# Patient Record
Sex: Male | Born: 1965
Health system: Southern US, Community
[De-identification: ages and names within clinical notes are randomized; demographics above are authoritative.]

## PROBLEM LIST (undated history)

## (undated) DIAGNOSIS — K219 Gastro-esophageal reflux disease without esophagitis: Secondary | ICD-10-CM

## (undated) DIAGNOSIS — H409 Unspecified glaucoma: Secondary | ICD-10-CM

## (undated) DIAGNOSIS — E669 Obesity, unspecified: Secondary | ICD-10-CM

## (undated) DIAGNOSIS — R51 Headache: Secondary | ICD-10-CM

## (undated) DIAGNOSIS — R519 Headache, unspecified: Secondary | ICD-10-CM

## (undated) DIAGNOSIS — E785 Hyperlipidemia, unspecified: Secondary | ICD-10-CM

## (undated) DIAGNOSIS — E119 Type 2 diabetes mellitus without complications: Secondary | ICD-10-CM

## (undated) DIAGNOSIS — Z720 Tobacco use: Secondary | ICD-10-CM

## (undated) HISTORY — PX: COLONOSCOPY: SHX174

## (undated) HISTORY — PX: WISDOM TOOTH EXTRACTION: SHX21

## (undated) HISTORY — PX: POLYPECTOMY: SHX149

## (undated) HISTORY — DX: Headache: R51

## (undated) HISTORY — DX: Obesity, unspecified: E66.9

## (undated) HISTORY — DX: Headache, unspecified: R51.9

## (undated) HISTORY — DX: Hyperlipidemia, unspecified: E78.5

## (undated) HISTORY — DX: Tobacco use: Z72.0

## (undated) HISTORY — DX: Gastro-esophageal reflux disease without esophagitis: K21.9

## (undated) HISTORY — DX: Unspecified glaucoma: H40.9

## (undated) HISTORY — DX: Type 2 diabetes mellitus without complications: E11.9

---

## 2001-05-22 ENCOUNTER — Emergency Department (HOSPITAL_COMMUNITY): Admission: EM | Admit: 2001-05-22 | Discharge: 2001-05-22 | Payer: Self-pay

## 2007-03-23 ENCOUNTER — Inpatient Hospital Stay (HOSPITAL_COMMUNITY): Admission: RE | Admit: 2007-03-23 | Discharge: 2007-03-24 | Payer: Self-pay | Admitting: Neurosurgery

## 2007-06-28 ENCOUNTER — Encounter: Admission: RE | Admit: 2007-06-28 | Discharge: 2007-06-28 | Payer: Self-pay | Admitting: Neurosurgery

## 2008-02-21 HISTORY — PX: BACK SURGERY: SHX140

## 2010-10-05 NOTE — Op Note (Signed)
[name], [name]               ACCOUNT NO.:  [id number]   MEDICAL RECORD NO.:  [id number]          PATIENT TYPE:  INP   LOCATION:  3172                         FACILITY:  [facility]   PHYSICIAN:  Kathaleen Maser. Pool, M.D.    DATE OF BIRTH:  February 19, 1966   DATE OF PROCEDURE:  03/23/2007  DATE OF DISCHARGE:                               OPERATIVE REPORT   ATTENDING PHYSICIAN:  Sherilyn Cooter A. Pool, M.D.   SERVICE:  Neurosurgery.   PREOPERATIVE DIAGNOSIS:  L5-S1 grade I lytic spondylolisthesis.   POSTOPERATIVE DIAGNOSIS:  L5-S1 grade I lytic spondylolisthesis.   PROCEDURE:  L5/S1 Gill procedure with bilateral L5 and S1 decompressive  foraminotomies.  L5-S1 posterior lumbar fusion utilizing Tangent  interbody allograft wedge, Telamon interbody PEEK cage and local  autografting.  Posterolateral arthrodesis utilizing nonsegmental pedicle  fixation and local autografting.   SURGEON:  Kathaleen Maser. Pool, M.D.   [name].   ANESTHESIA:  General oroendotracheal.   INDICATIONS FOR PROCEDURE:  Mr. Schauer is a 45 year old male with  history of back and lower extremity pain, right greater than left,  failing all conservative measure.  Workup demonstrates evidence of grade  I lytic L5-S1 spondylolisthesis and marked foraminal stenosis.  The  patient has been counseled as to the options.  He has decided to proceed  with L5-S1 decompression and fusion in the hopes of improving his  symptoms.   PROCEDURE IN DETAIL:  The patient was taken to the operating room and  placed in a supine position.  After adequate anesthesia was achieved the  patient was turned prone onto the Wilson frame appropriately padding the  patient's lumbar region, prepped and draped sterilely.  A 10 blade was  used to make a linear skin incision overlying the L5-S1 interspace.  This was carried down sharply to midline.  Subperiosteal dissection was  performed exposing the lamina and facet joints at L4, L5 and S1 as well  as the  transverse processes of L5 and the sacral ala bilaterally.  Deep  self-retaining retractor was placed.  Intraoperative fluoroscopy was  used and levels were confirmed.  A Gill procedure was then performed  using Leksell rongeurs, Kerrison rongeurs and high speed drill to  completely remove the lamina of L5 as well as the inferior facets of L5  bilaterally.  All bone was cleaned and used in later autografting.  Rudimentary inferior facets of L5 were also resected from the inferior  aspect of the pedicles of L5.  Superior facetectomies of S1 were  performed.  Wide decompressive foraminotomies were then performed along  the course of exiting L5 and S1 nerve roots bilaterally.  At this point,  a Gill procedure had been completed and wide foraminotomies had been  performed along the L5 and S1 nerve roots.  The epidural ring was placed  with clips, coagulated and cut.  Thecal sac and nerve roots were  mobilized and tracked towards the right.  Disk space was incised with 15  blade in a rectangular fashion.  Widened disk space was then cleaned  and then achieved using pituitary rongeurs, operating room  pituitary  rongeurs and Epstein curettes.  The procedure was then repeated on the  contralateral side.  The disk space was then sequentially distracted up  to 10 mm with a 10 mm distractor left in the patient's right side.  Thecal sac and nerve roots once again protected on the patient's left  side.  Disk space was then reamed and cut with a 10 mm Tangent  instrument.  Soft tissues were removed from the interspace.  A 10 x 22  mm Telamon cage packed with morselized autograft and Progenix putty was  then impacted into place and recessed approximately 2 mm posterior  cortical margin of L5.  Distractor was removed.  The patient's right  side disk space was then reamed and then cut with Tangent instruments on  the right side.  Soft tissues removed from the interspace.  The disk  space was then packed  with morselized autograft.  A 10 x 26 mm Tangent  wedge was then impacted into place and recessed approximately 1 mm from  the posterior cortical margin of L5.  Pedicles of L5 and S1 were then  identified using surface landmarks and intraoperative fluoroscopy.  The  superficial bone overlying the pedicle was then removed using a high  speed drill.  Each pedicle was then probed using pedicle awl.  Each  pedicle awl track was then probed and found to be solidly in bone.  Each  pedicle awl track was then tapped with a 5.25  mini screwdriver.  Each  screw tap hole was probed and found to be solid to bone.  Then 6.75 x 45  mm radius screws were placed bilaterally at L5; 6.75 x 40 mm screws were  placed bilaterally at S1.  Transverse processes and sacral ala were then  decorticated using high speed drill.  Morcellized autograft was then  packed posterolaterally for later fusion.  Short segment titanium rod  was then placed over the screw heads.  Locking caps were then placed  over the screw heads.  The locking caps then engaged and construct under  compression.  Wound was then irrigated with antibiotic solution.  Gelfoam was placed topically for hemostasis and found to be good.  Medium Hemovac drain was left after inspection.  The wound was then  closed in layers using Vicryl suture.  Steri-Strips and sterile dressing  were applied.  There were no complications.  The patient tolerated this  well and he returns to the recovery room postoperatively.           ______________________________  Kathaleen Maser Pool, M.D.     HAP/MEDQ  D:  03/23/2007  T:  03/24/2007  Job:  [id number]

## 2011-03-02 LAB — TYPE AND SCREEN
ABO/RH(D): O POS
Antibody Screen: NEGATIVE

## 2011-03-02 LAB — CBC
Hemoglobin: 17.5 — ABNORMAL HIGH
MCHC: 34.8
RBC: 5.73
WBC: 8.3

## 2011-03-02 LAB — ABO/RH: ABO/RH(D): O POS

## 2014-01-01 ENCOUNTER — Encounter: Payer: Self-pay | Admitting: Medical

## 2014-01-01 ENCOUNTER — Ambulatory Visit (INDEPENDENT_AMBULATORY_CARE_PROVIDER_SITE_OTHER): Payer: 59 | Admitting: Medical

## 2014-01-01 VITALS — BP 122/80 | HR 76 | Temp 98.1°F | Resp 16 | Ht 71.0 in | Wt 284.0 lb

## 2014-01-01 DIAGNOSIS — R5383 Other fatigue: Secondary | ICD-10-CM

## 2014-01-01 DIAGNOSIS — R400 Somnolence: Secondary | ICD-10-CM

## 2014-01-01 DIAGNOSIS — K219 Gastro-esophageal reflux disease without esophagitis: Secondary | ICD-10-CM

## 2014-01-01 DIAGNOSIS — R5381 Other malaise: Secondary | ICD-10-CM

## 2014-01-01 DIAGNOSIS — F17200 Nicotine dependence, unspecified, uncomplicated: Secondary | ICD-10-CM

## 2014-01-01 DIAGNOSIS — R609 Edema, unspecified: Secondary | ICD-10-CM

## 2014-01-01 DIAGNOSIS — G478 Other sleep disorders: Secondary | ICD-10-CM

## 2014-01-01 DIAGNOSIS — G4710 Hypersomnia, unspecified: Secondary | ICD-10-CM

## 2014-01-01 DIAGNOSIS — E669 Obesity, unspecified: Secondary | ICD-10-CM

## 2014-01-01 LAB — CBC
HEMATOCRIT: 48.1 % (ref 39.0–52.0)
HEMOGLOBIN: 17.2 g/dL — AB (ref 13.0–17.0)
MCH: 30.7 pg (ref 26.0–34.0)
MCHC: 35.8 g/dL (ref 30.0–36.0)
MCV: 85.7 fL (ref 78.0–100.0)
Platelets: 219 10*3/uL (ref 150–400)
RBC: 5.61 MIL/uL (ref 4.22–5.81)
RDW: 13.8 % (ref 11.5–15.5)
WBC: 7.2 10*3/uL (ref 4.0–10.5)

## 2014-01-01 LAB — COMPREHENSIVE METABOLIC PANEL
ALT: 40 U/L (ref 0–53)
AST: 25 U/L (ref 0–37)
Albumin: 4.4 g/dL (ref 3.5–5.2)
Alkaline Phosphatase: 61 U/L (ref 39–117)
BUN: 14 mg/dL (ref 6–23)
CALCIUM: 9.2 mg/dL (ref 8.4–10.5)
CHLORIDE: 105 meq/L (ref 96–112)
CO2: 23 meq/L (ref 19–32)
CREATININE: 0.85 mg/dL (ref 0.50–1.35)
GLUCOSE: 112 mg/dL — AB (ref 70–99)
Potassium: 4.5 mEq/L (ref 3.5–5.3)
Sodium: 139 mEq/L (ref 135–145)
Total Bilirubin: 0.6 mg/dL (ref 0.2–1.2)
Total Protein: 7.3 g/dL (ref 6.0–8.3)

## 2014-01-01 LAB — LIPID PANEL
CHOL/HDL RATIO: 4.9 ratio
CHOLESTEROL: 165 mg/dL (ref 0–200)
HDL: 34 mg/dL — ABNORMAL LOW (ref >39)
LDL Cholesterol: 109 mg/dL — ABNORMAL HIGH (ref 0–99)
TRIGLYCERIDES: 108 mg/dL (ref <150)
VLDL: 22 mg/dL (ref 0–40)

## 2014-01-01 LAB — TSH: TSH: 0.988 u[IU]/mL (ref 0.350–4.500)

## 2014-01-01 NOTE — Patient Instructions (Signed)
Thank you for giving me the opportunity to serve you today.    Your diagnosis today includes: Encounter Diagnoses  Name Primary?  . Edema Yes  . Obese   . Tobacco use disorder   . Non-restorative sleep   . Other malaise and fatigue   . Daytime somnolence   . Gastroesophageal reflux disease without esophagitis      Specific recommendations today include:  I strongly recommend you consider quitting tobacco and finding ways to quit tobacco  We can help you with this through counseling and medication  I recommend you start eating a healthier diet, work on losing weight  Don't add salt to food as this can cause excess fluid weight  We will call with lab results  We will likely consider sleep study going forward  Return pending studies.    I have included other useful information below for your review.  YOU CAN QUIT SMOKING!  Talk to your medical provider about using medicines to help you quit. These include nicotine replacement gum, lozenges, or skin patches.  Consider calling 1-800-QUIT-NOW, a toll free 24/7 hotline with free counseling to help you quit.  If you are ready to quit smoking or are thinking about it, congratulations! You have chosen to help yourself be healthier and live longer! There are lots of different ways to quit smoking. Nicotine gum, nicotine patches, a nicotine inhaler, or nicotine nasal spray can help with physical craving. Hypnosis, support groups, and medicines help break the habit of smoking. TIPS TO GET OFF AND STAY OFF CIGARETTES  Learn to predict your moods. Do not let a bad situation be your excuse to have a cigarette. Some situations in your life might tempt you to have a cigarette.   Ask friends and co-workers not to smoke around you.   Make your home smoke-free.   Never have "just one" cigarette. It leads to wanting another and another. Remind yourself of your decision to quit.   On a card, make a list of your reasons for not smoking.  Read it at least the same number of times a day as you have a cigarette. Tell yourself everyday, "I do not want to smoke. I choose not to smoke."   Ask someone at home or work to help you with your plan to quit smoking.   Have something planned after you eat or have a cup of coffee. Take a walk or get other exercise to perk you up. This will help to keep you from overeating.   Try a relaxation exercise to calm you down and decrease your stress. Remember, you may be tense and nervous the first two weeks after you quit. This will pass.   Find new activities to keep your hands busy. Play with a pen, coin, or rubber band. Doodle or draw things on paper.   Brush your teeth right after eating. This will help cut down the craving for the taste of tobacco after meals. You can try mouthwash too.   Try gum, breath mints, or diet candy to keep something in your mouth.  IF YOU SMOKE AND WANT TO QUIT:  Do not stock up on cigarettes. Never buy a carton. Wait until one pack is finished before you buy another.   Never carry cigarettes with you at work or at home.   Keep cigarettes as far away from you as possible. Leave them with someone else.   Never carry matches or a lighter with you.   Ask yourself, "Do I  need this cigarette or is this just a reflex?"   Bet with someone that you can quit. Put cigarette money in a piggy bank every morning. If you smoke, you give up the money. If you do not smoke, by the end of the week, you keep the money.   Keep trying. It takes 21 days to change a habit!  Document Released: 03/05/2009 Document Revised: 01/19/2011 Document Reviewed: 03/05/2009 [facility] Patient Information 2012 [place].    Edema Edema is an abnormal buildup of fluids in your bodytissues. Edema is somewhatdependent on gravity to pull the fluid to the lowest place in your body. That makes the condition more common in the legs and thighs (lower extremities). Painless swelling of the feet  and ankles is common and becomes more likely as you get older. It is also common in looser tissues, like around your eyes.  When the affected area is squeezed, the fluid may move out of that spot and leave a dent for a few moments. This dent is called pitting.  CAUSES  There are many possible causes of edema. Eating too much salt and being on your feet or sitting for a long time can cause edema in your legs and ankles. Hot weather may make edema worse. Common medical causes of edema include:  Heart failure.  Liver disease.  Kidney disease.  Weak blood vessels in your legs.  Cancer.  An injury.  Pregnancy.  Some medications.  Obesity. SYMPTOMS  Edema is usually painless.Your skin may look swollen or shiny.  DIAGNOSIS  Your health care provider may be able to diagnose edema by asking about your medical history and doing a physical exam. You may need to have tests such as X-rays, an electrocardiogram, or blood tests to check for medical conditions that may cause edema.  TREATMENT  Edema treatment depends on the cause. If you have heart, liver, or kidney disease, you need the treatment appropriate for these conditions. General treatment may include:  Elevation of the affected body part above the level of your heart.  Compression of the affected body part. Pressure from elastic bandages or support stockings squeezes the tissues and forces fluid back into the blood vessels. This keeps fluid from entering the tissues.  Restriction of fluid and salt intake.  Use of a water pill (diuretic). These medications are appropriate only for some types of edema. They pull fluid out of your body and make you urinate more often. This gets rid of fluid and reduces swelling, but diuretics can have side effects. Only use diuretics as directed by your health care provider. HOME CARE INSTRUCTIONS   Keep the affected body part above the level of your heart when you are lying down.   Do not sit still  or stand for prolonged periods.   Do not put anything directly under your knees when lying down.  Do not wear constricting clothing or garters on your upper legs.   Exercise your legs to work the fluid back into your blood vessels. This may help the swelling go down.   Wear elastic bandages or support stockings to reduce ankle swelling as directed by your health care provider.   Eat a low-salt diet to reduce fluid if your health care provider recommends it.   Only take medicines as directed by your health care provider. SEEK MEDICAL CARE IF:   Your edema is not responding to treatment.  You have heart, liver, or kidney disease and notice symptoms of edema.  You have  edema in your legs that does not improve after elevating them.   You have sudden and unexplained weight gain. SEEK IMMEDIATE MEDICAL CARE IF:   You develop shortness of breath or chest pain.   You cannot breathe when you lie down.  You develop pain, redness, or warmth in the swollen areas.   You have heart, liver, or kidney disease and suddenly get edema.  You have a fever and your symptoms suddenly get worse. MAKE SURE YOU:   Understand these instructions.  Will watch your condition.  Will get help right away if you are not doing well or get worse. Document Released: 05/09/2005 Document Revised: 09/23/2013 Document Reviewed: 03/01/2013 [facility] Patient Information 2015 [place], [place]. This information is not intended to replace advice given to you by your health care provider. Make sure you discuss any questions you have with your health care provider.

## 2014-01-01 NOTE — Progress Notes (Signed)
Subjective:   [name] is a 48 y.o. male presenting on 01/01/2014 with legs and arms are swelling  Here as a new patient today accompanied by his wife.   No routine medical care in at least 4 years.  He is here today due to swelling in his arms and legs.  This has apparently been going on for the last few years, but he decided to get this checked out.  Otherwise he has been in his usual state of health denies any other major symptoms.  His wife feel strongly that he has had sleep apnea for many years even when he was less heavy.  He has non-restful sleep, fatigue, snores really loud, quit breathing in his sleep - long standing.  Also wife noticed that the other day his mother checked his blood sugar 4 hours after a meal and his sugar was over 170.  He has no history of diabetes heart disease or kidney failure or other. No prior sleep study. He does note pain in his feet at the end of the day but he works 6 days a week use a 10 hours a day in [employer], wears steel toed boots, on his feet all day active.  No other aggravating or relieving factors.  No other complaint.  Past Medical History  Diagnosis Date  . Headache   . Obesity   . Tobacco use   . Prostatitis    Family History  Problem Relation Age of Onset  . Diabetes Mother   . Cancer Father 33    prostate  . Diabetes Sister   . Heart disease Sister 53    MI, pacemaker  . Other Brother     died in motorcycle accident    Review of Systems ROS as in subjective      Objective:   BP 122/80  Pulse 76  Temp(Src) 98.1 F (36.7 C) (Oral)  Resp 16  Ht 5\' 11"  (1.803 m)  Wt 284 lb (128.822 kg)  BMI 39.63 kg/m2   General appearance: alert, no distress, WD/WN, obese white male, tobacco odor HEENT: normocephalic, sclerae anicteric, TMs pearly, nares patent, no discharge or erythema, pharynx normal Oral cavity: MMM, missing several molars, no lesions Neck: supple, 19.5 inch diameter neck, no lymphadenopathy, no  thyromegaly, no masses vomiting JVD Heart: RRR, normal S1, S2, no murmurs Lungs: CTA bilaterally, no wheezes, rhonchi, or rales Abdomen: +bs, soft, non tender, non distended, no masses, no hepatomegaly, no splenomegaly Pulses: 2+ symmetric, upper and lower extremities, normal cap refill Extremities no edema   Adult ECG Report  Indication: edema  Rate: 74 bpm  Rhythm: normal sinus rhythm  QRS Axis: 65 degrees  PR Interval: 183ms  QRS Duration: 30ms  QTc: 446ms  Conduction Disturbances: none and P wave enlargement  Other Abnormalities: none  Patient's cardiac risk factors are: male gender, obesity (BMI >= 30 kg/m2) and smoking/ tobacco exposure.  EKG comparison: none   Narrative Interpretation: atrial enlargement, otherwise normal EKG.         Assessment: Encounter Diagnoses  Name Primary?  . Edema Yes  . Obese   . Tobacco use disorder   . Non-restorative sleep   . Other malaise and fatigue   . Daytime somnolence   . Gastroesophageal reflux disease without esophagitis      Plan: We discussed his symptoms, concerns.  Things that we addressed initially is his weight, his tobacco use, symptoms very strongly suggestive of sleep apnea, and possible diabetes.  We discussed  many possible causes of edema.  We will check a comprehensive panel of labs today, reviewed EKG above. Pending labs consider sleep study  We discussed the need to work on diet, weight loss, and strongly advise he consider quitting smoking  epworth sleep scale -17   [name] was seen today for legs and arms are swelling.  Diagnoses and associated orders for this visit:  Edema - EKG 12-Lead - PR ELECTROCARDIOGRAM, COMPLETE - Comprehensive metabolic panel - CBC - TSH - Lipid panel - Hemoglobin A1c - POCT urinalysis dipstick  Obese - EKG 12-Lead - PR ELECTROCARDIOGRAM, COMPLETE - Comprehensive metabolic panel - CBC - TSH - Lipid panel - Hemoglobin A1c - POCT urinalysis dipstick  Tobacco use  disorder - EKG 12-Lead - PR ELECTROCARDIOGRAM, COMPLETE - Comprehensive metabolic panel - CBC - TSH - Lipid panel - Hemoglobin A1c - POCT urinalysis dipstick  Non-restorative sleep - EKG 12-Lead - PR ELECTROCARDIOGRAM, COMPLETE - Comprehensive metabolic panel - CBC - TSH - Lipid panel - Hemoglobin A1c - POCT urinalysis dipstick  Other malaise and fatigue - EKG 12-Lead - PR ELECTROCARDIOGRAM, COMPLETE - Comprehensive metabolic panel - CBC - TSH - Lipid panel - Hemoglobin A1c - POCT urinalysis dipstick  Daytime somnolence - EKG 12-Lead - PR ELECTROCARDIOGRAM, COMPLETE - Comprehensive metabolic panel - CBC - TSH - Lipid panel - Hemoglobin A1c - POCT urinalysis dipstick  Gastroesophageal reflux disease without esophagitis - EKG 12-Lead - PR ELECTROCARDIOGRAM, COMPLETE - Comprehensive metabolic panel - CBC - TSH - Lipid panel - Hemoglobin A1c - POCT urinalysis dipstick    Return pending studies.

## 2014-01-02 ENCOUNTER — Other Ambulatory Visit: Payer: Self-pay | Admitting: Medical

## 2014-01-02 LAB — HEMOGLOBIN A1C
Hgb A1c MFr Bld: 6.3 % — ABNORMAL HIGH (ref <5.7)
Mean Plasma Glucose: 134 mg/dL — ABNORMAL HIGH (ref <117)

## 2014-01-02 MED ORDER — HYDROCHLOROTHIAZIDE 25 MG PO TABS: 25.0000 mg | ORAL_TABLET | Freq: Every day | ORAL | Status: DC

## 2014-01-02 MED ORDER — METFORMIN HCL 500 MG PO TABS: 500.0000 mg | ORAL_TABLET | Freq: Every day | ORAL | Status: DC

## 2014-01-02 NOTE — Progress Notes (Signed)
Spoke with Audelia Acton before he left, he said to offer patient Metformin daily and or HCTZ for this issue of foot swelling. Patient wishes to use both the Metformin and HCTZ. Audelia Acton, please send to pharmacy and if Andria Frames can schedule sleep study. Thanks

## 2014-01-02 NOTE — Progress Notes (Signed)
LM to [facility] 

## 2014-01-07 ENCOUNTER — Telehealth: Payer: Self-pay | Admitting: Internal Medicine

## 2014-01-07 MED ORDER — METFORMIN HCL 500 MG PO TABS: 500.0000 mg | ORAL_TABLET | Freq: Two times a day (BID) | ORAL | Status: DC

## 2014-01-07 NOTE — Telephone Encounter (Signed)
Pharmacy called and wanted to verify med directions. Per [name] he is suppose to take 2 tablets daily. So I had to resubmit it with new directions and quantity

## 2014-01-13 ENCOUNTER — Other Ambulatory Visit: Payer: Self-pay | Admitting: Family Medicine

## 2014-01-13 DIAGNOSIS — G478 Other sleep disorders: Secondary | ICD-10-CM

## 2014-01-13 DIAGNOSIS — E669 Obesity, unspecified: Secondary | ICD-10-CM

## 2014-01-20 ENCOUNTER — Encounter: Payer: Self-pay | Admitting: Family Medicine

## 2014-02-04 NOTE — Progress Notes (Signed)
This encounter was created in error - please disregard.

## 2017-06-30 ENCOUNTER — Ambulatory Visit (INDEPENDENT_AMBULATORY_CARE_PROVIDER_SITE_OTHER): Payer: No Typology Code available for payment source | Admitting: Family Medicine

## 2017-06-30 ENCOUNTER — Encounter: Payer: Self-pay | Admitting: Family Medicine

## 2017-06-30 VITALS — BP 140/78 | HR 95 | Temp 98.1°F | Ht 71.5 in | Wt 280.2 lb

## 2017-06-30 DIAGNOSIS — G8929 Other chronic pain: Secondary | ICD-10-CM | POA: Diagnosis not present

## 2017-06-30 DIAGNOSIS — D489 Neoplasm of uncertain behavior, unspecified: Secondary | ICD-10-CM | POA: Diagnosis not present

## 2017-06-30 DIAGNOSIS — M545 Low back pain: Secondary | ICD-10-CM

## 2017-06-30 MED ORDER — MELOXICAM 7.5 MG PO TABS: 7.5000 mg | ORAL_TABLET | Freq: Every day | ORAL | 0 refills | Status: DC

## 2017-06-30 NOTE — Patient Instructions (Addendum)
Do not shower for the rest of the day. When you do wash it, use only soap and water. Do not vigorously scrub. Apply triple antibiotic ointment (like Neosporin) twice daily. Keep the area clean and dry.   Things to look out for: increasing pain not relieved by ibuprofen/acetaminophen, fevers, spreading redness, drainage of pus, or foul odor.  It takes around 1 week to get the results of your biopsy back.   Ice/cold pack over area for 10-15 min twice daily.  Heat (pad or rice pillow in microwave) over affected area, 10-15 minutes twice daily.   OK to take Tylenol 1000 mg (2 extra strength tabs) or 975 mg (3 regular strength tabs) every 6 hours as needed.  EXERCISES  RANGE OF MOTION (ROM) AND STRETCHING EXERCISES - Low Back Prain Most people with lower back pain will find that their symptoms get worse with excessive bending forward (flexion) or arching at the lower back (extension). The exercises that will help resolve your symptoms will focus on the opposite motion.  If you have pain, numbness or tingling which travels down into your buttocks, leg or foot, the goal of the therapy is for these symptoms to move closer to your back and eventually resolve. Sometimes, these leg symptoms will get better, but your lower back pain may worsen. This is often an indication of progress in your rehabilitation. Be very alert to any changes in your symptoms and the activities in which you participated in the 24 hours prior to the change. Sharing this information with your caregiver will allow him or her to most efficiently treat your condition. These exercises may help you when beginning to rehabilitate your injury. Your symptoms may resolve with or without further involvement from your physician, physical therapist or athletic trainer. While completing these exercises, remember:   Restoring tissue flexibility helps normal motion to return to the joints. This allows healthier, less painful movement and  activity.  An effective stretch should be held for at least 30 seconds.  A stretch should never be painful. You should only feel a gentle lengthening or release in the stretched tissue. FLEXION RANGE OF MOTION AND STRETCHING EXERCISES:  STRETCH - Flexion, Single Knee to Chest   Lie on a firm bed or floor with both legs extended in front of you.  Keeping one leg in contact with the floor, bring your opposite knee to your chest. Hold your leg in place by either grabbing behind your thigh or at your knee.  Pull until you feel a gentle stretch in your low back. Hold 30 seconds.  Slowly release your grasp and repeat the exercise with the opposite side. Repeat 2 times. Complete this exercise 3 times per week.   STRETCH - Flexion, Double Knee to Chest  Lie on a firm bed or floor with both legs extended in front of you.  Keeping one leg in contact with the floor, bring your opposite knee to your chest.  Tense your stomach muscles to support your back and then lift your other knee to your chest. Hold your legs in place by either grabbing behind your thighs or at your knees.  Pull both knees toward your chest until you feel a gentle stretch in your low back. Hold 30 seconds.  Tense your stomach muscles and slowly return one leg at a time to the floor. Repeat 2 times. Complete this exercise 3 times per week.   STRETCH - Low Trunk Rotation  Lie on a firm bed or floor. Keeping  your legs in front of you, bend your knees so they are both pointed toward the ceiling and your feet are flat on the floor.  Extend your arms out to the side. This will stabilize your upper body by keeping your shoulders in contact with the floor.  Gently and slowly drop both knees together to one side until you feel a gentle stretch in your low back. Hold for 30 seconds.  Tense your stomach muscles to support your lower back as you bring your knees back to the starting position. Repeat the exercise to the other  side. Repeat 2 times. Complete this exercise at least 3 times per week.   EXTENSION RANGE OF MOTION AND FLEXIBILITY EXERCISES:  STRETCH - Extension, Prone on Elbows   Lie on your stomach on the floor, a bed will be too soft. Place your palms about shoulder width apart and at the height of your head.  Place your elbows under your shoulders. If this is too painful, stack pillows under your chest.  Allow your body to relax so that your hips drop lower and make contact more completely with the floor.  Hold this position for 30 seconds.  Slowly return to lying flat on the floor. Repeat 2 times. Complete this exercise 3 times per week.   RANGE OF MOTION - Extension, Prone Press Ups  Lie on your stomach on the floor, a bed will be too soft. Place your palms about shoulder width apart and at the height of your head.  Keeping your back as relaxed as possible, slowly straighten your elbows while keeping your hips on the floor. You may adjust the placement of your hands to maximize your comfort. As you gain motion, your hands will come more underneath your shoulders.  Hold this position 30 seconds.  Slowly return to lying flat on the floor. Repeat 2 times. Complete this exercise 3 times per week.   RANGE OF MOTION- Quadruped, Neutral Spine   Assume a hands and knees position on a firm surface. Keep your hands under your shoulders and your knees under your hips. You may place padding under your knees for comfort.  Drop your head and point your tailbone toward the ground below you. This will round out your lower back like an angry cat. Hold this position for 30 seconds.  Slowly lift your head and release your tail bone so that your back sags into a large arch, like an old horse.  Hold this position for 30 seconds.  Repeat this until you feel limber in your low back.  Now, find your "sweet spot." This will be the most comfortable position somewhere between the two previous positions. This  is your neutral spine. Once you have found this position, tense your stomach muscles to support your low back.  Hold this position for 30 seconds. Repeat 2 times. Complete this exercise 3 times per week.   STRENGTHENING EXERCISES - Low Back Sprain These exercises may help you when beginning to rehabilitate your injury. These exercises should be done near your "sweet spot." This is the neutral, low-back arch, somewhere between fully rounded and fully arched, that is your least painful position. When performed in this safe range of motion, these exercises can be used for people who have either a flexion or extension based injury. These exercises may resolve your symptoms with or without further involvement from your physician, physical therapist or athletic trainer. While completing these exercises, remember:   Muscles can gain both the endurance and  the strength needed for everyday activities through controlled exercises.  Complete these exercises as instructed by your physician, physical therapist or athletic trainer. Increase the resistance and repetitions only as guided.  You may experience muscle soreness or fatigue, but the pain or discomfort you are trying to eliminate should never worsen during these exercises. If this pain does worsen, stop and make certain you are following the directions exactly. If the pain is still present after adjustments, discontinue the exercise until you can discuss the trouble with your caregiver.  STRENGTHENING - Deep Abdominals, Pelvic Tilt   Lie on a firm bed or floor. Keeping your legs in front of you, bend your knees so they are both pointed toward the ceiling and your feet are flat on the floor.  Tense your lower abdominal muscles to press your low back into the floor. This motion will rotate your pelvis so that your tail bone is scooping upwards rather than pointing at your feet or into the floor. With a gentle tension and even breathing, hold this position  for 3 seconds. Repeat 2 times. Complete this exercise 3 times per week.   STRENGTHENING - Abdominals, Crunches   Lie on a firm bed or floor. Keeping your legs in front of you, bend your knees so they are both pointed toward the ceiling and your feet are flat on the floor. Cross your arms over your chest.  Slightly tip your chin down without bending your neck.  Tense your abdominals and slowly lift your trunk high enough to just clear your shoulder blades. Lifting higher can put excessive stress on the lower back and does not further strengthen your abdominal muscles.  Control your return to the starting position. Repeat 2 times. Complete this exercise 3 times per week.   STRENGTHENING - Quadruped, Opposite UE/LE Lift   Assume a hands and knees position on a firm surface. Keep your hands under your shoulders and your knees under your hips. You may place padding under your knees for comfort.  Find your neutral spine and gently tense your abdominal muscles so that you can maintain this position. Your shoulders and hips should form a rectangle that is parallel with the floor and is not twisted.  Keeping your trunk steady, lift your right hand no higher than your shoulder and then your left leg no higher than your hip. Make sure you are not holding your breath. Hold this position for 30 seconds.  Continuing to keep your abdominal muscles tense and your back steady, slowly return to your starting position. Repeat with the opposite arm and leg. Repeat 2 times. Complete this exercise 3 times per week.   STRENGTHENING - Abdominals and Quadriceps, Straight Leg Raise   Lie on a firm bed or floor with both legs extended in front of you.  Keeping one leg in contact with the floor, bend the other knee so that your foot can rest flat on the floor.  Find your neutral spine, and tense your abdominal muscles to maintain your spinal position throughout the exercise.  Slowly lift your straight leg off  the floor about 6 inches for a count of 3, making sure to not hold your breath.  Still keeping your neutral spine, slowly lower your leg all the way to the floor. Repeat this exercise with each leg 2 times. Complete this exercise 3 times per week.  POSTURE AND BODY MECHANICS CONSIDERATIONS - Low Back Sprain Keeping correct posture when sitting, standing or completing your activities will reduce  the stress put on different body tissues, allowing injured tissues a chance to heal and limiting painful experiences. The following are general guidelines for improved posture.  While reading these guidelines, remember:  The exercises prescribed by your provider will help you have the flexibility and strength to maintain correct postures.  The correct posture provides the best environment for your joints to work. All of your joints have less wear and tear when properly supported by a spine with good posture. This means you will experience a healthier, less painful body.  Correct posture must be practiced with all of your activities, especially prolonged sitting and standing. Correct posture is as important when doing repetitive low-stress activities (typing) as it is when doing a single heavy-load activity (lifting).  RESTING POSITIONS Consider which positions are most painful for you when choosing a resting position. If you have pain with flexion-based activities (sitting, bending, stooping, squatting), choose a position that allows you to rest in a less flexed posture. You would want to avoid curling into a fetal position on your side. If your pain worsens with extension-based activities (prolonged standing, working overhead), avoid resting in an extended position such as sleeping on your stomach. Most people will find more comfort when they rest with their spine in a more neutral position, neither too rounded nor too arched. Lying on a non-sagging bed on your side with a pillow between your knees, or on your  back with a pillow under your knees will often provide some relief. Keep in mind, being in any one position for a prolonged period of time, no matter how correct your posture, can still lead to stiffness.  PROPER SITTING POSTURE In order to minimize stress and discomfort on your spine, you must sit with correct posture. Sitting with good posture should be effortless for a healthy body. Returning to good posture is a gradual process. Many people can work toward this most comfortably by using various supports until they have the flexibility and strength to maintain this posture on their own. When sitting with proper posture, your ears will fall over your shoulders and your shoulders will fall over your hips. You should use the back of the chair to support your upper back. Your lower back will be in a neutral position, just slightly arched. You may place a small pillow or folded towel at the base of your lower back for  support.  When working at a desk, create an environment that supports good, upright posture. Without extra support, muscles tire, which leads to excessive strain on joints and other tissues. Keep these recommendations in mind:  CHAIR:  A chair should be able to slide under your desk when your back makes contact with the back of the chair. This allows you to work closely.  The chair's height should allow your eyes to be level with the upper part of your monitor and your hands to be slightly lower than your elbows.  BODY POSITION  Your feet should make contact with the floor. If this is not possible, use a foot rest.  Keep your ears over your shoulders. This will reduce stress on your neck and low back.  INCORRECT SITTING POSTURES  If you are feeling tired and unable to assume a healthy sitting posture, do not slouch or slump. This puts excessive strain on your back tissues, causing more damage and pain. Healthier options include:  Using more support, like a lumbar  pillow.  Switching tasks to something that requires you to be  upright or walking.  Talking a brief walk.  Lying down to rest in a neutral-spine position.  PROLONGED STANDING WHILE SLIGHTLY LEANING FORWARD  When completing a task that requires you to lean forward while standing in one place for a long time, place either foot up on a stationary 2-4 inch high object to help maintain the best posture. When both feet are on the ground, the lower back tends to lose its slight inward curve. If this curve flattens (or becomes too large), then the back and your other joints will experience too much stress, tire more quickly, and can cause pain.  CORRECT STANDING POSTURES Proper standing posture should be assumed with all daily activities, even if they only take a few moments, like when brushing your teeth. As in sitting, your ears should fall over your shoulders and your shoulders should fall over your hips. You should keep a slight tension in your abdominal muscles to brace your spine. Your tailbone should point down to the ground, not behind your body, resulting in an over-extended swayback posture.   INCORRECT STANDING POSTURES  Common incorrect standing postures include a forward head, locked knees and/or an excessive swayback. WALKING Walk with an upright posture. Your ears, shoulders and hips should all line-up.  PROLONGED ACTIVITY IN A FLEXED POSITION When completing a task that requires you to bend forward at your waist or lean over a low surface, try to find a way to stabilize 3 out of 4 of your limbs. You can place a hand or elbow on your thigh or rest a knee on the surface you are reaching across. This will provide you more stability, so that your muscles do not tire as quickly. By keeping your knees relaxed, or slightly bent, you will also reduce stress across your lower back. CORRECT LIFTING TECHNIQUES  DO :  Assume a wide stance. This will provide you more stability and the opportunity  to get as close as possible to the object which you are lifting.  Tense your abdominals to brace your spine. Bend at the knees and hips. Keeping your back locked in a neutral-spine position, lift using your leg muscles. Lift with your legs, keeping your back straight.  Test the weight of unknown objects before attempting to lift them.  Try to keep your elbows locked down at your sides in order get the best strength from your shoulders when carrying an object.     Always ask for help when lifting heavy or awkward objects. INCORRECT LIFTING TECHNIQUES DO NOT:   Lock your knees when lifting, even if it is a small object.  Bend and twist. Pivot at your feet or move your feet when needing to change directions.  Assume that you can safely pick up even a paperclip without proper posture.

## 2017-06-30 NOTE — Progress Notes (Signed)
Chief Complaint  Patient presents with  . Establish Care       New Patient Visit SUBJECTIVE: HPI: [name] is an 52 y.o.male who is being seen for establishing care.  The patient was previously seen at another office quite a ways away, new insurance.  Chronic midline low back pain Hx of fusion 11 years ago w Dr. Annette Stable. R leg heaviness over past several years. Denies weakness, numbness, tingling, loss of bowel/bladder function. Has used Tylenol at home with mild relief.  He does not do a home program for his low back. He has never been to PT.   Has skin lesions on his back that he would like looked at. No personal or famhx of skin cancer. He was outside a lot when he was younger. His wife noticed, not him, unsure how long or if any changes.   No Known Allergies  Past Medical History:  Diagnosis Date  . Headache   . Obesity   . Prostatitis   . Tobacco use    Past Surgical History:  Procedure Laterality Date  . BACK SURGERY  02/2008  . WISDOM TOOTH EXTRACTION     Social History   Socioeconomic History  . Marital status: Married  Tobacco Use  . Smoking status: Current Every Day Smoker    Packs/day: 1.00    Years: 30.00    Pack years: 30.00  . Smokeless tobacco: Never Used  Substance and Sexual Activity  . Alcohol use: Yes    Comment: occasional  . Drug use: No  Social History Narrative   Married, works in Architect   Family History  Problem Relation Age of Onset  . Diabetes Mother   . Cancer Father 24       prostate  . Diabetes Sister   . Heart disease Sister 76       MI, pacemaker  . Other Brother        died in motorcycle accident     Current Outpatient Medications:  .  meloxicam (MOBIC) 7.5 MG tablet, Take 1 tablet (7.5 mg total) by mouth daily., Disp: 30 tablet, Rfl: 0  ROS Cardiovascular: Denies chest pain  Respiratory: Denies dyspnea   OBJECTIVE: BP 140/78 (BP Location: Left Arm, Patient Position: Sitting, Cuff Size: Large)   Pulse 95    Temp 98.1 F (36.7 C) (Oral)   Ht 5' 11.5" (1.816 m)   Wt 280 lb 4 oz (127.1 kg)   SpO2 97%   BMI 38.54 kg/m   Constitutional: -  VS reviewed -  Well developed, well nourished, appears stated age -  No apparent distress  Psychiatric: -  Oriented to person, place, and time -  Memory intact -  Affect and mood normal -  Fluent conversation, good eye contact -  Judgment and insight age appropriate  Eye: -  Conjunctivae clear, no discharge -  Pupils symmetric, round, reactive to light  ENMT: -  MMM    Pharynx moist, no exudate, no erythema  Neck: -  No gross swelling, no palpable masses -  Thyroid midline, not enlarged, mobile, no palpable masses  Cardiovascular: -  RRR -  No LE edema  Respiratory: -  Normal respiratory effort, no accessory muscle use, no retraction -  Breath sounds equal, no wheezes, no ronchi, no crackles  Musculoskeletal: -  No clubbing, no cyanosis -  +TTp over b/l lumb paraspinal msc -  5/5 strength throughout -  Neg straight leg  Neuro:  -  DTR's equal  and symmetric -  Sensation in LE's equal to light touch b/l -  No cerebellar signs  Skin: -  Warm and dry -  On the L upper back, there is a dome shaped and hyperpigmented lesion 0.6 cm in diameter, no erythema or fluctuance -  On the L lower back, there is a fleshy colored lesion approximately 0.3 x 1.3 cm in dimension, TeLinde ectasias noted , no erythema, tenderness,   Procedure note; shave biopsy; L upper back and L lower back Informed consent was obtained. The area was cleaned with alcohol and injected with 1 mL of 1% lidocaine with epinephrine. A Dermablade was slightly bent and used to cut under the area of interest. The specimen was placed in a sterile specimen cup and sent to the lab. The area was then cauterized ensuring adequate hemostasis. The area was dressed with triple antibiotic ointment and a bandage. This process was repeated in the L lower back, 1.5 cc's of 1% lidocaine w epi injected.   Dermablade used again, sent to pathology. Cauterized, adequate hemostasis obtained. Triple antibiotic ointment and a bandage was placed over this area also. There were no complications noted. The patient tolerated the procedure well.   ASSESSMENT/PLAN: Neoplasm of uncertain behavior - Plan: Dermatology pathology, PR SHAV SKIN LES 0.6-1.0 CM TRUNK,ARM,LEG  Chronic right-sided low back pain without sciatica - Plan: meloxicam (MOBIC) 7.5 MG tablet  Patient instructed to sign release of records form from his previous PCP. We will send areas of concern to the pathology team.  Aftercare instructions verbalized and written down for the patient. He has very poor range of motion of his hamstrings.  I believe his poor flexibility and weight are contributing to pain.  I will call in an as needed anti-inflammatory, continue Tylenol as needed.  Heat, ice, consider physical therapy in the future if no improvement.  I do not believe any acute surgical management or imaging is warranted at this time. Patient should return at his earliest convenience for a fasting physical.. The patient voiced understanding and agreement to the plan.   Nicolaus, DO 06/30/17  4:50 PM

## 2017-06-30 NOTE — Progress Notes (Signed)
Pre visit review using our clinic review tool, if applicable. No additional management support is needed unless otherwise documented below in the visit note. 

## 2017-07-11 ENCOUNTER — Telehealth: Payer: Self-pay | Admitting: *Deleted

## 2017-07-11 NOTE — Telephone Encounter (Signed)
Received Dermatopathology Report results from [facility]; forwarded to provider/SLS 02/19    

## 2017-07-28 ENCOUNTER — Other Ambulatory Visit: Payer: Self-pay | Admitting: Family Medicine

## 2017-07-28 DIAGNOSIS — G8929 Other chronic pain: Secondary | ICD-10-CM

## 2017-07-28 DIAGNOSIS — M545 Low back pain: Principal | ICD-10-CM

## 2017-08-18 ENCOUNTER — Encounter: Payer: Self-pay | Admitting: Family Medicine

## 2017-08-18 ENCOUNTER — Ambulatory Visit (INDEPENDENT_AMBULATORY_CARE_PROVIDER_SITE_OTHER): Payer: No Typology Code available for payment source | Admitting: Family Medicine

## 2017-08-18 VITALS — BP 128/70 | HR 82 | Temp 98.0°F | Ht 72.0 in | Wt 273.4 lb

## 2017-08-18 DIAGNOSIS — R7303 Prediabetes: Secondary | ICD-10-CM

## 2017-08-18 DIAGNOSIS — Z0000 Encounter for general adult medical examination without abnormal findings: Secondary | ICD-10-CM | POA: Diagnosis not present

## 2017-08-18 DIAGNOSIS — Z125 Encounter for screening for malignant neoplasm of prostate: Secondary | ICD-10-CM

## 2017-08-18 DIAGNOSIS — N529 Male erectile dysfunction, unspecified: Secondary | ICD-10-CM | POA: Diagnosis not present

## 2017-08-18 DIAGNOSIS — Z1211 Encounter for screening for malignant neoplasm of colon: Secondary | ICD-10-CM | POA: Diagnosis not present

## 2017-08-18 DIAGNOSIS — Z114 Encounter for screening for human immunodeficiency virus [HIV]: Secondary | ICD-10-CM

## 2017-08-18 DIAGNOSIS — Z1159 Encounter for screening for other viral diseases: Secondary | ICD-10-CM | POA: Diagnosis not present

## 2017-08-18 DIAGNOSIS — Z23 Encounter for immunization: Secondary | ICD-10-CM | POA: Diagnosis not present

## 2017-08-18 LAB — HEMOGLOBIN A1C: Hgb A1c MFr Bld: 6.3 % (ref 4.6–6.5)

## 2017-08-18 LAB — COMPREHENSIVE METABOLIC PANEL
ALBUMIN: 4.1 g/dL (ref 3.5–5.2)
ALT: 36 U/L (ref 0–53)
AST: 22 U/L (ref 0–37)
Alkaline Phosphatase: 62 U/L (ref 39–117)
BUN: 16 mg/dL (ref 6–23)
CALCIUM: 9.4 mg/dL (ref 8.4–10.5)
CO2: 25 meq/L (ref 19–32)
CREATININE: 0.81 mg/dL (ref 0.40–1.50)
Chloride: 107 mEq/L (ref 96–112)
GFR: 105.45 mL/min (ref >60.00)
Glucose, Bld: 139 mg/dL — ABNORMAL HIGH (ref 70–99)
Potassium: 4.5 mEq/L (ref 3.5–5.1)
Sodium: 139 mEq/L (ref 135–145)
Total Bilirubin: 0.9 mg/dL (ref 0.2–1.2)
Total Protein: 7.3 g/dL (ref 6.0–8.3)

## 2017-08-18 LAB — LIPID PANEL
CHOLESTEROL: 152 mg/dL (ref 0–200)
HDL: 30.2 mg/dL — ABNORMAL LOW (ref >39.00)
LDL CALC: 104 mg/dL — AB (ref 0–99)
NONHDL: 121.87
Total CHOL/HDL Ratio: 5
Triglycerides: 90 mg/dL (ref 0.0–149.0)
VLDL: 18 mg/dL (ref 0.0–40.0)

## 2017-08-18 LAB — PSA: PSA: 0.53 ng/mL (ref 0.10–4.00)

## 2017-08-18 MED ORDER — SILDENAFIL CITRATE 50 MG PO TABS: 50.0000 mg | ORAL_TABLET | Freq: Every day | ORAL | 0 refills | Status: DC | PRN

## 2017-08-18 MED FILL — SILDENAFIL CITRATE 50 MG TA: 50 | 30 days supply | Qty: 6 | Fill #0

## 2017-08-18 NOTE — Addendum Note (Signed)
Addended by: Ames Coupe on: 08/18/2017 08:29 AM   Modules accepted: Orders

## 2017-08-18 NOTE — Progress Notes (Signed)
Pre visit review using our clinic review tool, if applicable. No additional management support is needed unless otherwise documented below in the visit note. 

## 2017-08-18 NOTE — Addendum Note (Signed)
Addended by: Sharon Seller B on: 08/18/2017 08:34 AM   Modules accepted: Orders

## 2017-08-18 NOTE — Progress Notes (Addendum)
Chief Complaint  Patient presents with  . Annual Exam    Well Male [name] is here for a complete physical.   His last physical was >1 year ago.  Current diet: in general, a "healthy" diet. Keto diet.   Current exercise: stretches/exercises given for low back Weight trend: lower back Does pt snore? No. Daytime fatigue? No. Seat belt? Yes.    Health maintenance Shingrix- No Colonoscopy- No Tetanus- No HIV- No Hep C- No Prostate cancer screening- No   Past Medical History:  Diagnosis Date  . Headache   . Obesity   . Tobacco use     Past Surgical History:  Procedure Laterality Date  . BACK SURGERY  02/2008  . WISDOM TOOTH EXTRACTION     Medications  Current Outpatient Medications on File Prior to Visit  Medication Sig Dispense Refill  . meloxicam (MOBIC) 7.5 MG tablet TAKE 1 TABLET BY MOUTH EVERY DAY 30 tablet 0   Allergies No Known Allergies Family History Family History  Problem Relation Age of Onset  . Diabetes Mother   . Cancer Father 11       prostate  . Diabetes Sister   . Heart disease Sister 68       MI, pacemaker  . Other Brother        died in motorcycle accident    Review of Systems: Constitutional:  no fevers or chills Eye:  no recent significant change in vision Ear/Nose/Mouth/Throat:  Ears:  no hearing loss Nose/Mouth/Throat:  no complaints of nasal congestion or bleeding, no sore throat Cardiovascular:  no chest pain, no palpitations Respiratory:  no cough and no shortness of breath Gastrointestinal:  no abdominal pain, no change in bowel habits, no nausea, vomiting, diarrhea, or constipation and no black or bloody stool GU:  Male: negative for dysuria, frequency, and incontinence and negative for prostate symptoms Musculoskeletal/Extremities: +LBP (chronic), otherwise no pain, redness, or swelling of the joints Integumentary (Skin/Breast):  no abnormal skin lesions reported Neurologic:  no headaches Endocrine: No unexpected weight  changes Hematologic/Lymphatic:  no abnormal bleeding  Exam BP 128/70 (BP Location: Left Arm, Patient Position: Sitting, Cuff Size: Large)   Pulse 82   Temp 98 F (36.7 C) (Oral)   Ht 6' (1.829 m)   Wt 273 lb 6 oz (124 kg)   SpO2 93%   BMI 37.08 kg/m  General:  well developed, well nourished, in no apparent distress Skin:  no significant moles, warts, or growths Head:  no masses, lesions, or tenderness Eyes:  pupils equal and round, sclera anicteric without injection Ears:  canals without lesions, TMs shiny without retraction, no obvious effusion, no erythema Nose:  nares patent, septum midline, mucosa normal Throat/Pharynx:  lips and gingiva without lesion; tongue and uvula midline; non-inflamed pharynx; no exudates or postnasal drainage Neck: neck supple without adenopathy, thyromegaly, or masses Lungs:  clear to auscultation, breath sounds equal bilaterally, no respiratory distress Cardio:  regular rate and rhythm without murmurs, heart sounds without clicks or rubs Abdomen:  abdomen soft, nontender; bowel sounds normal; no masses or organomegaly Genital (male): circumcised penis, no lesions or discharge; testes present bilaterally without masses or tenderness Rectal: Deferred Musculoskeletal:  symmetrical muscle groups noted without atrophy or deformity Extremities:  no clubbing, cyanosis, or edema, no deformities, no skin discoloration Neuro:  gait normal; deep tendon reflexes normal and symmetric Psych: well oriented with normal range of affect and appropriate judgment/insight  Assessment and Plan  Well adult exam - Plan: Comprehensive  metabolic panel, Lipid panel  Screening for HIV (human immunodeficiency virus) - Plan: HIV antibody  Encounter for hepatitis C screening test for low risk patient - Plan: Hepatitis C antibody  Prediabetes - Plan: Hemoglobin A1c  Screening for malignant neoplasm of prostate - Plan: PSA  Erectile dysfunction, unspecified erectile  dysfunction type - Plan: sildenafil (VIAGRA) 50 MG tablet  Screen for colon cancer - Plan: Ambulatory referral to Gastroenterology   Well 52 y.o. male. Counseled on diet and exercise. Counseled on risks and benefits of prostate cancer screening with PSA. The patient agrees to undergo testing given his strong famhx. Immunizations, labs, and further orders as above. Follow up in 1 yr pending above. The patient voiced understanding and agreement to the plan.  [place], DO 08/18/17 8:28 AM

## 2017-08-18 NOTE — Patient Instructions (Addendum)
Call your pharmacy to see about the availability of the new shingles vaccine (Shingrix).  Aim to do some physical exertion for 150 minutes per week. This is typically divided into 5 days per week, 30 minutes per day. The activity should be enough to get your heart rate up. Anything is better than nothing if you have time constraints.  Keep the diet as clean as possible  Great work with cutting down on the smokes!  Give [place] 2-3 business days to get the results of your labs back. If labs are normal, you will likely receive a letter in the mail unless you have MyChart. This can take longer than 2-3 business days.   If the medicine is too expensive, try calling your insurance company to ask for covered medications for erectile dysfunction. If no options, consider trying to find coupons online for medicine like Cilalis, Levitra, and Viagra.   Let us know if you need anything.

## 2017-08-19 LAB — HIV ANTIBODY (ROUTINE TESTING W REFLEX): HIV 1&2 Ab, 4th Generation: NONREACTIVE

## 2017-08-19 LAB — HEPATITIS C ANTIBODY
HEP C AB: NONREACTIVE
SIGNAL TO CUT-OFF: 0.02 (ref <1.00)

## 2017-08-31 ENCOUNTER — Encounter: Payer: Self-pay | Admitting: Gastroenterology

## 2017-09-01 ENCOUNTER — Encounter: Payer: Self-pay | Admitting: Family Medicine

## 2017-09-01 ENCOUNTER — Other Ambulatory Visit: Payer: Self-pay | Admitting: Family Medicine

## 2017-09-01 MED ORDER — ATORVASTATIN CALCIUM 40 MG PO TABS: 40.0000 mg | ORAL_TABLET | Freq: Every day | ORAL | 3 refills | Status: DC

## 2017-09-01 MED FILL — ATORVASTATIN 40 MG TABLET: 40 | 90 days supply | Qty: 90 | Fill #0

## 2017-09-01 NOTE — Progress Notes (Signed)
Lipitor sent to [facility].

## 2017-10-20 ENCOUNTER — Ambulatory Visit (AMBULATORY_SURGERY_CENTER): Payer: Self-pay | Admitting: *Deleted

## 2017-10-20 ENCOUNTER — Other Ambulatory Visit: Payer: Self-pay

## 2017-10-20 VITALS — Ht 70.0 in | Wt 266.0 lb

## 2017-10-20 DIAGNOSIS — Z1211 Encounter for screening for malignant neoplasm of colon: Secondary | ICD-10-CM

## 2017-10-20 MED ORDER — NA SULFATE-K SULFATE-MG SULF 17.5-3.13-1.6 GM/177ML PO SOLN
1.0000 | Freq: Once | ORAL | 0 refills | Status: AC
Start: 2017-10-20 — End: 2017-10-20

## 2017-10-20 MED FILL — SUPREP BOWEL PREP KIT: 17.5-3.13-1 | 1 days supply | Qty: 354 | Fill #0

## 2017-10-20 NOTE — Progress Notes (Signed)
No egg or soy allergy known to patient  No issues with past sedation with any surgeries  or procedures, no intubation problems  No diet pills per patient No home 02 use per patient  No blood thinners per patient  Pt denies issues with constipation  No A fib or A flutter  EMMI video sent to pt's e mail pt declined   

## 2017-10-27 ENCOUNTER — Encounter: Payer: Self-pay | Admitting: Family Medicine

## 2017-10-30 ENCOUNTER — Encounter: Payer: Self-pay | Admitting: Gastroenterology

## 2017-10-30 ENCOUNTER — Other Ambulatory Visit: Payer: Self-pay | Admitting: Family Medicine

## 2017-10-30 DIAGNOSIS — Z0000 Encounter for general adult medical examination without abnormal findings: Secondary | ICD-10-CM

## 2017-11-03 ENCOUNTER — Other Ambulatory Visit: Payer: Self-pay

## 2017-11-03 ENCOUNTER — Encounter: Payer: Self-pay | Admitting: Gastroenterology

## 2017-11-03 ENCOUNTER — Ambulatory Visit (AMBULATORY_SURGERY_CENTER): Payer: No Typology Code available for payment source | Admitting: Gastroenterology

## 2017-11-03 VITALS — BP 130/80 | HR 67 | Temp 98.9°F | Resp 23 | Ht 72.0 in | Wt 273.0 lb

## 2017-11-03 DIAGNOSIS — D123 Benign neoplasm of transverse colon: Secondary | ICD-10-CM

## 2017-11-03 DIAGNOSIS — D125 Benign neoplasm of sigmoid colon: Secondary | ICD-10-CM | POA: Diagnosis not present

## 2017-11-03 DIAGNOSIS — D124 Benign neoplasm of descending colon: Secondary | ICD-10-CM

## 2017-11-03 DIAGNOSIS — Z1211 Encounter for screening for malignant neoplasm of colon: Secondary | ICD-10-CM

## 2017-11-03 MED ORDER — SODIUM CHLORIDE 0.9 % IV SOLN: 500.0000 mL | Freq: Once | INTRAVENOUS | Status: DC

## 2017-11-03 NOTE — Progress Notes (Signed)
Called to room to assist during endoscopic procedure.  Patient ID and intended procedure confirmed with present staff. Received instructions for my participation in the procedure from the performing physician.  

## 2017-11-03 NOTE — Progress Notes (Signed)
Pt's states no medical or surgical changes since previsit or office visit. 

## 2017-11-03 NOTE — Patient Instructions (Signed)
**   Handouts given on polyps and diverticulosis **   YOU HAD AN ENDOSCOPIC PROCEDURE TODAY AT THE [facility]:   Refer to the procedure report that was given to you for any specific questions about what was found during the examination.  If the procedure report does not answer your questions, please call your gastroenterologist to clarify.  If you requested that your care partner not be given the details of your procedure findings, then the procedure report has been included in a sealed envelope for you to review at your convenience later.  YOU SHOULD EXPECT: Some feelings of bloating in the abdomen. Passage of more gas than usual.  Walking can help get rid of the air that was put into your GI tract during the procedure and reduce the bloating. If you had a lower endoscopy (such as a colonoscopy or flexible sigmoidoscopy) you may notice spotting of blood in your stool or on the toilet paper. If you underwent a bowel prep for your procedure, you may not have a normal bowel movement for a few days.  Please Note:  You might notice some irritation and congestion in your nose or some drainage.  This is from the oxygen used during your procedure.  There is no need for concern and it should clear up in a day or so.  SYMPTOMS TO REPORT IMMEDIATELY:   Following lower endoscopy (colonoscopy or flexible sigmoidoscopy):  Excessive amounts of blood in the stool  Significant tenderness or worsening of abdominal pains  Swelling of the abdomen that is new, acute  Fever of 100F or higher  For urgent or emergent issues, a gastroenterologist can be reached at any hour by calling [phone].   DIET:  We do recommend a small meal at first, but then you may proceed to your regular diet.  Drink plenty of fluids but you should avoid alcoholic beverages for 24 hours.  ACTIVITY:  You should plan to take it easy for the rest of today and you should NOT DRIVE or use heavy machinery until tomorrow (because  of the sedation medicines used during the test).    FOLLOW UP: Our staff will call the number listed on your records the next business day following your procedure to check on you and address any questions or concerns that you may have regarding the information given to you following your procedure. If we do not reach you, we will leave a message.  However, if you are feeling well and you are not experiencing any problems, there is no need to return our call.  We will assume that you have returned to your regular daily activities without incident.  If any biopsies were taken you will be contacted by phone or by letter within the next 1-3 weeks.  Please call us at [phone] if you have not heard about the biopsies in 3 weeks.    SIGNATURES/CONFIDENTIALITY: You and/or your care partner have signed paperwork which will be entered into your electronic medical record.  These signatures attest to the fact that that the information above on your After Visit Summary has been reviewed and is understood.  Full responsibility of the confidentiality of this discharge information lies with you and/or your care-partner. 

## 2017-11-03 NOTE — Progress Notes (Signed)
Report given to PACU, vss 

## 2017-11-03 NOTE — Op Note (Signed)
[place] Patient Name: [name] Procedure Date: 11/03/2017 8:11 AM MRN: [id number] Endoscopist: Mauri Pole , MD Age: 52 Referring MD:  Date of Birth: [birth year] Gender: Male Account #: [id number] Procedure:                Colonoscopy Indications:              Screening for colorectal malignant neoplasm, This                            is the patient's first colonoscopy Medicines:                Monitored Anesthesia Care Procedure:                Pre-Anesthesia Assessment:                           - Prior to the procedure, a History and Physical                            was performed, and patient medications and                            allergies were reviewed. The patient's tolerance of                            previous anesthesia was also reviewed. The risks                            and benefits of the procedure and the sedation                            options and risks were discussed with the patient.                            All questions were answered, and informed consent                            was obtained. Prior Anticoagulants: The patient has                            taken no previous anticoagulant or antiplatelet                            agents. ASA Grade Assessment: II - A patient with                            mild systemic disease. After reviewing the risks                            and benefits, the patient was deemed in                            satisfactory condition to undergo the procedure.  After obtaining informed consent, the colonoscope                            was passed under direct vision. Throughout the                            procedure, the patient's blood pressure, pulse, and                            oxygen saturations were monitored continuously. The                            Model PCF-H190DL [id number]) scope was introduced                            through the anus and  advanced to the the terminal                            ileum, with identification of the appendiceal                            orifice and IC valve. The colonoscopy was performed                            without difficulty. The patient tolerated the                            procedure well. The quality of the bowel                            preparation was excellent. The terminal ileum,                            ileocecal valve, appendiceal orifice, and rectum                            were photographed. Due to technical error some of                            the photographs were not saved. Scope In: 8:06:08 AM Scope Out: 8:30:21 AM Scope Withdrawal Time: 0 hours 21 minutes 58 seconds  Total Procedure Duration: 0 hours 24 minutes 13 seconds  Findings:                 The perianal and digital rectal examinations were                            normal.                           Three pedunculated polyps were found in the sigmoid                            colon, descending colon and transverse colon. The  polyps were 11 to 15 mm in size. These polyps were                            removed with a hot snare. Resection and retrieval                            were complete.                           A 5 mm polyp was found in the sigmoid colon. The                            polyp was sessile. The polyp was removed with a                            cold snare. Resection and retrieval were complete.                           A 3 mm polyp was found in the transverse colon. The                            polyp was sessile. The polyp was removed with a                            cold biopsy forceps. Resection and retrieval were                            complete.                           A few small-mouthed diverticula were found in the                            sigmoid colon and descending colon.                           Non-bleeding internal hemorrhoids  were found during                            retroflexion. The hemorrhoids were small. Complications:            No immediate complications. Estimated Blood Loss:     Estimated blood loss was minimal. Impression:               - Three 11 to 15 mm polyps in the sigmoid colon, in                            the descending colon and in the transverse colon,                            removed with a hot snare. Resected and retrieved.                           - One  5 mm polyp in the sigmoid colon, removed with                            a cold snare. Resected and retrieved.                           - One 3 mm polyp in the transverse colon, removed                            with a cold biopsy forceps. Resected and retrieved.                           - Diverticulosis in the sigmoid colon and in the                            descending colon.                           - Non-bleeding internal hemorrhoids. Recommendation:           - Patient has a contact number available for                            emergencies. The signs and symptoms of potential                            delayed complications were discussed with the                            patient. Return to normal activities tomorrow.                            Written discharge instructions were provided to the                            patient.                           - Resume previous diet.                           - Continue present medications.                           - Await pathology results.                           - Repeat colonoscopy in 3 years for surveillance                            based on pathology results. Mauri Pole, MD 11/03/2017 8:35:59 AM This report has been signed electronically.

## 2017-11-06 ENCOUNTER — Telehealth: Payer: Self-pay

## 2017-11-06 NOTE — Telephone Encounter (Signed)
  Follow up Call-  Call back number 11/03/2017  Post procedure Call Back phone  # [phone]  Permission to leave phone message Yes  Some recent data might be hidden     No answer when called number given.

## 2017-11-06 NOTE — Telephone Encounter (Deleted)
  Follow up Call-  Call back number 11/03/2017  Post procedure Call Back phone  # [phone]  Permission to leave phone message Yes  Some recent data might be hidden     Patient questions:  Do you have a fever, pain , or abdominal swelling? {yes no:314532} Pain Score  {NUMBERS; 0-10:5044} *  Have you tolerated food without any problems? {yes no:314532}  Have you been able to return to your normal activities? {yes no:314532}  Do you have any questions about your discharge instructions: Diet   {yes no:314532} Medications  {yes no:314532} Follow up visit  {yes no:314532}  Do you have questions or concerns about your Care? {yes no:314532}  Actions: * If pain score is 4 or above: {ACTION; [facility] ENDO PAIN >4:21563::"No action needed, pain <4."}

## 2017-11-06 NOTE — Telephone Encounter (Deleted)
  Follow up Call-  Call back number 11/03/2017  Post procedure Call Back phone  # [phone]  Permission to leave phone message Yes  Some recent data might be hidden     Patient questions:  Do you have a fever, pain , or abdominal swelling? {yes no:314532} Pain Score  {NUMBERS; 0-10:5044} *  Have you tolerated food without any problems? {yes no:314532}  Have you been able to return to your normal activities? {yes no:314532}  Do you have any questions about your discharge instructions: Diet   {yes no:314532} Medications  {yes no:314532} Follow up visit  {yes no:314532}  Do you have questions or concerns about your Care? {yes no:314532}  Actions: * If pain score is 4 or above: {ACTION; [facility] ENDO PAIN >4:21563::"No action needed, pain <4."}

## 2017-11-13 ENCOUNTER — Encounter: Payer: Self-pay | Admitting: Gastroenterology

## 2018-01-18 MED FILL — LATANOPROST 0.005% EYE DRP: 0.005 | 25 days supply | Qty: 3 | Fill #0

## 2018-02-14 ENCOUNTER — Encounter: Payer: Self-pay | Admitting: Family Medicine

## 2018-02-14 ENCOUNTER — Ambulatory Visit (INDEPENDENT_AMBULATORY_CARE_PROVIDER_SITE_OTHER): Payer: No Typology Code available for payment source | Admitting: Family Medicine

## 2018-02-14 VITALS — BP 122/62 | HR 103 | Temp 100.3°F | Resp 16 | Wt 260.0 lb

## 2018-02-14 DIAGNOSIS — M545 Low back pain, unspecified: Secondary | ICD-10-CM

## 2018-02-14 DIAGNOSIS — R319 Hematuria, unspecified: Secondary | ICD-10-CM | POA: Diagnosis not present

## 2018-02-14 LAB — POCT URINALYSIS DIPSTICK
Bilirubin, UA: NEGATIVE
Blood, UA: 10
Glucose, UA: NEGATIVE
Ketones, UA: NEGATIVE
Leukocytes, UA: NEGATIVE
Nitrite, UA: NEGATIVE
PH UA: 6 (ref 5.0–8.0)
Protein, UA: NEGATIVE
Spec Grav, UA: 1.025 (ref 1.010–1.025)
UROBILINOGEN UA: 0.2 U/dL

## 2018-02-14 MED ORDER — TAMSULOSIN HCL 0.4 MG PO CAPS: 0.4000 mg | ORAL_CAPSULE | Freq: Every day | ORAL | 0 refills | Status: DC

## 2018-02-14 MED ORDER — KETOROLAC TROMETHAMINE 60 MG/2ML IM SOLN
60.0000 mg | Freq: Once | INTRAMUSCULAR | Status: AC
  Administered 2018-02-14: 60 mg via INTRAMUSCULAR

## 2018-02-14 MED ORDER — HYDROCODONE-ACETAMINOPHEN 5-325 MG PO TABS: 1.0000 | ORAL_TABLET | Freq: Four times a day (QID) | ORAL | 0 refills | Status: DC | PRN

## 2018-02-14 MED FILL — HYDROCODON-APAP 5-325: 5-325 | 3 days supply | Qty: 15 | Fill #0

## 2018-02-14 MED FILL — TAMSULOSIN HCL 0.4 MG CAP: 0.4 | 30 days supply | Qty: 30 | Fill #0

## 2018-02-14 NOTE — Patient Instructions (Addendum)
We are treating you for a stone.  Stay VERY well hydrated with water.   Ibuprofen 400-600 mg (2-3 over the counter strength tabs) every 6 hours as needed for pain.  Do not drink alcohol, do any illicit/street drugs, drive or do anything that requires alertness while on this medicine.   Let us know if you need anything.

## 2018-02-14 NOTE — Progress Notes (Signed)
Musculoskeletal Exam  Patient: [name] DOB: [birth year]  DOS: 02/14/2018  SUBJECTIVE:  Chief Complaint:   Chief Complaint  Patient presents with  . Back Pain    right side down into leg xs 3 days    [name] is a 52 y.o.  male for evaluation and treatment of his back pain.   Onset:  3 days ago. No inj or change in activity.  Location: lower Radiates down R lower ext Character:  sharp  Progression of issue:  is unchanged Associated symptoms: fevers 101 F, urine has been dark, pain radiates to R groin No hx of renal stones Denies bowel/bladder incontinence or weakness Treatment: to date has been OTC NSAIDS.   Neurovascular symptoms: no  ROS: Musculoskeletal/Extremities: +back pain Neurologic: no numbness, tingling no weakness   Past Medical History:  Diagnosis Date  . Diabetes mellitus without complication (HCC)    hx- borderline- diet controlled   . GERD (gastroesophageal reflux disease)   . Headache   . Hyperlipidemia   . Obesity   . Tobacco use     Objective:  VITAL SIGNS: BP 122/62   Pulse (!) 103   Temp 100.3 F (37.9 C) (Oral)   Resp 16   Wt 260 lb (117.9 kg)   SpO2 93%   BMI 35.26 kg/m  Constitutional: Well formed, well developed. No acute distress. HENT: Normocephalic, atraumatic.  Thorax & Lungs:  No accessory muscle use Extremities: No clubbing. No cyanosis. No edema.  Skin: Warm. Dry. No erythema. No rash.  Musculoskeletal: low back.   Tenderness to palpation: yes Deformity: no  Ecchymosis: no Straight leg test: negative for Poor hamstring flexibility b/l. Neg Lloyd's Neurologic: Normal sensory function. No focal deficits noted.  Psychiatric: Normal mood. Age appropriate judgment and insight. Alert & oriented x 3.    Assessment:  Acute left-sided low back pain without sciatica - Plan: POCT urinalysis dipstick, Urine Culture, HYDROcodone-acetaminophen (NORCO/VICODIN) 5-325 MG tablet, tamsulosin (FLOMAX) 0.4 MG CAPS capsule,  ketorolac (TORADOL) injection 60 mg  Hematuria, unspecified type - Plan: POCT urinalysis dipstick, Urine Culture, HYDROcodone-acetaminophen (NORCO/VICODIN) 5-325 MG tablet, tamsulosin (FLOMAX) 0.4 MG CAPS capsule, ketorolac (TORADOL) injection 60 mg  Plan: Orders as above. Will tx for kidney stone. Cx for thoroughness.  Stretches/exercises, heat, ice, NSAIDs. F/u in 3-4 weeks to repeat urine to ensure resolution of hematuria. The patient voiced understanding and agreement to the plan.   [place], DO 02/14/18  4:27 PM

## 2018-02-15 ENCOUNTER — Emergency Department (HOSPITAL_COMMUNITY)
Admission: EM | Admit: 2018-02-15 | Discharge: 2018-02-16 | Disposition: A | Discharge status: Home / Self Care | Payer: No Typology Code available for payment source | Attending: Emergency Medicine | Admitting: Emergency Medicine

## 2018-02-15 ENCOUNTER — Other Ambulatory Visit: Payer: Self-pay

## 2018-02-15 ENCOUNTER — Emergency Department (HOSPITAL_COMMUNITY): Payer: No Typology Code available for payment source

## 2018-02-15 ENCOUNTER — Encounter (HOSPITAL_COMMUNITY): Payer: Self-pay | Admitting: Emergency Medicine

## 2018-02-15 DIAGNOSIS — R1031 Right lower quadrant pain: Secondary | ICD-10-CM | POA: Diagnosis not present

## 2018-02-15 DIAGNOSIS — E119 Type 2 diabetes mellitus without complications: Secondary | ICD-10-CM | POA: Diagnosis not present

## 2018-02-15 DIAGNOSIS — R109 Unspecified abdominal pain: Secondary | ICD-10-CM

## 2018-02-15 DIAGNOSIS — R319 Hematuria, unspecified: Secondary | ICD-10-CM | POA: Diagnosis not present

## 2018-02-15 DIAGNOSIS — Z79899 Other long term (current) drug therapy: Secondary | ICD-10-CM | POA: Insufficient documentation

## 2018-02-15 DIAGNOSIS — F17200 Nicotine dependence, unspecified, uncomplicated: Secondary | ICD-10-CM | POA: Insufficient documentation

## 2018-02-15 DIAGNOSIS — N50811 Right testicular pain: Secondary | ICD-10-CM | POA: Insufficient documentation

## 2018-02-15 DIAGNOSIS — N50819 Testicular pain, unspecified: Secondary | ICD-10-CM

## 2018-02-15 DIAGNOSIS — R509 Fever, unspecified: Secondary | ICD-10-CM | POA: Diagnosis present

## 2018-02-15 LAB — CBC WITH DIFFERENTIAL/PLATELET
Abs Immature Granulocytes: 0.1 10*3/uL (ref 0.0–0.1)
Basophils Absolute: 0.1 10*3/uL (ref 0.0–0.1)
Basophils Relative: 1 %
EOS PCT: 0 %
Eosinophils Absolute: 0 10*3/uL (ref 0.0–0.7)
HEMATOCRIT: 46.3 % (ref 39.0–52.0)
HEMOGLOBIN: 15.9 g/dL (ref 13.0–17.0)
Immature Granulocytes: 1 %
Lymphocytes Relative: 6 %
Lymphs Abs: 0.5 10*3/uL — ABNORMAL LOW (ref 0.7–4.0)
MCH: 29.8 pg (ref 26.0–34.0)
MCHC: 34.3 g/dL (ref 30.0–36.0)
MCV: 86.7 fL (ref 78.0–100.0)
Monocytes Absolute: 0.6 10*3/uL (ref 0.1–1.0)
Monocytes Relative: 7 %
Neutro Abs: 7.9 10*3/uL — ABNORMAL HIGH (ref 1.7–7.7)
Neutrophils Relative %: 85 %
Platelets: 172 10*3/uL (ref 150–400)
RBC: 5.34 MIL/uL (ref 4.22–5.81)
RDW: 12.4 % (ref 11.5–15.5)
WBC: 9.3 10*3/uL (ref 4.0–10.5)

## 2018-02-15 LAB — URINALYSIS, ROUTINE W REFLEX MICROSCOPIC
Bacteria, UA: NONE SEEN
Bilirubin Urine: NEGATIVE
GLUCOSE, UA: 50 mg/dL — AB
KETONES UR: 20 mg/dL — AB
LEUKOCYTES UA: NEGATIVE
NITRITE: NEGATIVE
PROTEIN: 30 mg/dL — AB
Specific Gravity, Urine: 1.026 (ref 1.005–1.030)
pH: 5 (ref 5.0–8.0)

## 2018-02-15 LAB — COMPREHENSIVE METABOLIC PANEL
ALT: 29 U/L (ref 0–44)
AST: 27 U/L (ref 15–41)
Albumin: 3.4 g/dL — ABNORMAL LOW (ref 3.5–5.0)
Alkaline Phosphatase: 51 U/L (ref 38–126)
Anion gap: 12 (ref 5–15)
BILIRUBIN TOTAL: 0.9 mg/dL (ref 0.3–1.2)
BUN: 17 mg/dL (ref 6–20)
CHLORIDE: 99 mmol/L (ref 98–111)
CO2: 19 mmol/L — ABNORMAL LOW (ref 22–32)
CREATININE: 0.97 mg/dL (ref 0.61–1.24)
Calcium: 8.7 mg/dL — ABNORMAL LOW (ref 8.9–10.3)
GFR calc non Af Amer: >60 mL/min (ref >60)
Glucose, Bld: 169 mg/dL — ABNORMAL HIGH (ref 70–99)
POTASSIUM: 4.2 mmol/L (ref 3.5–5.1)
Sodium: 130 mmol/L — ABNORMAL LOW (ref 135–145)
TOTAL PROTEIN: 7.2 g/dL (ref 6.5–8.1)

## 2018-02-15 LAB — URINE CULTURE
MICRO NUMBER:: 91152862
Result:: NO GROWTH
SPECIMEN QUALITY:: ADEQUATE

## 2018-02-15 LAB — I-STAT CG4 LACTIC ACID, ED: Lactic Acid, Venous: 1.43 mmol/L (ref 0.5–1.9)

## 2018-02-15 LAB — LIPASE, BLOOD: LIPASE: 31 U/L (ref 11–51)

## 2018-02-15 MED ORDER — ONDANSETRON 4 MG PO TBDP
4.0000 mg | ORAL_TABLET | Freq: Once | ORAL | Status: AC
  Administered 2018-02-15: 4 mg via ORAL
  Filled 2018-02-15: qty 1

## 2018-02-15 MED ORDER — HYDROCODONE-ACETAMINOPHEN 5-325 MG PO TABS: 1.0000 | ORAL_TABLET | Freq: Once | ORAL | Status: DC

## 2018-02-15 MED ORDER — ACETAMINOPHEN 500 MG PO TABS
1000.0000 mg | ORAL_TABLET | Freq: Once | ORAL | Status: AC
  Administered 2018-02-15: 1000 mg via ORAL
  Filled 2018-02-15: qty 2

## 2018-02-15 NOTE — ED Provider Notes (Signed)
Patient placed in Quick Look pathway, seen and evaluated   Chief Complaint: fever, flank pain, urinary symptoms  HPI:   52 y.o. male with a history of diabetes, hypertension, hyperlipidemia who presents emergency department today for fever.  Patient reports that over the last several days he has developed fever, chills, right-sided flank pain, hematuria, urinary frequency.  He seen by his PCP yesterday where he was treated for suspected kidney stone and prescribed Flomax and Norco.  He reports today he started developing a fever of 102.  He has tried ibuprofen for his symptoms without any relief.  He denies any associated abdominal pain, testicular pain, emesis or diarrhea.  He notes some nausea.  He had a normal bowel movement earlier today.  No previous abdominal surgeries. No HA, CP, SOB, Cough.   ROS:  Positive ROS: (+) fever, flank pain, hematuria Negative ROS: (-) abdominal pain  Physical Exam:   Gen: No distress  Neuro: Awake and Alert  Skin: Warm    Focused Exam: Heart tachycardic, with regular rhythmLungs CTAB; Abd soft, NT, no rebound or guarding; No CVA ttp. Ext 2+ pedal pulses bilaterally, no edema.  BP (!) 141/66 (BP Location: Right Arm)   Pulse (!) 102   Temp (!) 103 F (39.4 C) (Oral)   Resp 20   SpO2 97%   Plan:  Based on initial evaluation, labs are indicated and radiology studies are indicated.  Patient counseled on process, plan, and necessity for staying for completing the evaluation."  Initiation of care has begun. The patient has been counseled on the process, plan, and necessity for staying for the completion/evaluation, and the remainder of the medical screening examination    Lorelle Gibbs 02/15/18 2103    Carmin Muskrat, MD 02/16/18 0021

## 2018-02-15 NOTE — ED Triage Notes (Signed)
Pt presents with known kidney stone since wed, with associated R lower back pain, groin pain and hematuria; pt now with fever and constipation

## 2018-02-16 ENCOUNTER — Emergency Department (HOSPITAL_COMMUNITY): Payer: No Typology Code available for payment source

## 2018-02-16 MED ORDER — AZITHROMYCIN 250 MG PO TABS: 250.0000 mg | ORAL_TABLET | Freq: Every day | ORAL | 0 refills | Status: DC

## 2018-02-16 MED ORDER — IBUPROFEN 800 MG PO TABS
800.0000 mg | ORAL_TABLET | Freq: Once | ORAL | Status: AC
  Administered 2018-02-16: 800 mg via ORAL
  Filled 2018-02-16: qty 1

## 2018-02-16 MED FILL — AZITHROMYCIN 250 MG TABLET: 250 | 5 days supply | Qty: 6 | Fill #0

## 2018-02-16 NOTE — Discharge Instructions (Signed)
Take the prescribed medication as directed. Follow-up with your primary care doctor. Would follow-up with urology if continued issues with groin pain, hematuria, etc. Return to the ED for new or worsening symptoms.

## 2018-02-16 NOTE — ED Provider Notes (Addendum)
[facility] Provider Note   CSN: [id number] Arrival date & time: 02/15/18  2030     History   Chief Complaint Chief Complaint  Patient presents with  . Fever  . Flank Pain    HPI [name] is a 52 y.o. male.  The history is provided by the patient and medical records.  Fever    Flank Pain     52 year old male with history of borderline diabetes, GERD, hyperlipidemia, presenting to the ED for fever.  For the past several days he has had fever, chills, right-sided flank pain, blood in the urine and urinary frequency.  He was seen at his primary care office yesterday and was treated for suspected kidney stone.  He was started on Vicodin and Flomax.  States pain is improved but he continues running fever.  T-max 102F.  States he continues to have some pain in the right flank and some discomfort in his right genital area.  He denies any swelling or difficulty urinating.  No trauma to the groin.  Denies history of kidney stones in the past.  States he has had a little bit of a cough but has been nonproductive.  Denies any recent travel or sick contacts.  Past Medical History:  Diagnosis Date  . Diabetes mellitus without complication (HCC)    hx- borderline- diet controlled   . GERD (gastroesophageal reflux disease)   . Headache   . Hyperlipidemia   . Obesity   . Tobacco use     Patient Active Problem List   Diagnosis Date Noted  . Erectile dysfunction 08/18/2017    Past Surgical History:  Procedure Laterality Date  . BACK SURGERY  02/2008  . WISDOM TOOTH EXTRACTION          Home Medications    Prior to Admission medications   Medication Sig Start Date End Date Taking? Authorizing Provider  atorvastatin (LIPITOR) 40 MG tablet Take 1 tablet (40 mg total) by mouth daily. 09/01/17   Shelda Pal, DO  esomeprazole (NEXIUM) 20 MG capsule Take 20 mg by mouth daily at 12 noon.    [provider]    HYDROcodone-acetaminophen (NORCO/VICODIN) 5-325 MG tablet Take 1 tablet by mouth every 6 (six) hours as needed for moderate pain. 02/14/18   Shelda Pal, DO  meloxicam (MOBIC) 7.5 MG tablet TAKE 1 TABLET BY MOUTH EVERY DAY 07/28/17   Wendling, Crosby Oyster, DO  sildenafil (VIAGRA) 50 MG tablet Take 1 tablet (50 mg total) by mouth daily as needed for erectile dysfunction. 08/18/17   Shelda Pal, DO  tamsulosin (FLOMAX) 0.4 MG CAPS capsule Take 1 capsule (0.4 mg total) by mouth daily after supper. 02/14/18   Shelda Pal, DO    Family History Family History  Problem Relation Age of Onset  . Diabetes Mother   . Cancer Father 57       prostate  . Prostate cancer Father   . Diabetes Sister   . Heart disease Sister 53       MI, pacemaker  . Other Brother        died in motorcycle accident  . Bladder Cancer Brother   . Colon cancer Neg Hx   . Colon polyps Neg Hx   . Rectal cancer Neg Hx   . Stomach cancer Neg Hx     Social History Social History   Tobacco Use  . Smoking status: Current Every Day Smoker    Packs/day: 1.00  Years: 30.00    Pack years: 30.00  . Smokeless tobacco: Never Used  Substance Use Topics  . Alcohol use: Yes    Comment: occasional  . Drug use: No     Allergies   Patient has no known allergies.   Review of Systems Review of Systems  Constitutional: Positive for fever.  Genitourinary: Positive for flank pain.  All other systems reviewed and are negative.    Physical Exam Updated Vital Signs BP (!) 138/95   Pulse 89   Temp 99.8 F (37.7 C) (Oral)   Resp 16   SpO2 95%   Physical Exam  Constitutional: He is oriented to person, place, and time. He appears well-developed and well-nourished.  HENT:  Head: Normocephalic and atraumatic.  Mouth/Throat: Oropharynx is clear and moist.  Eyes: Pupils are equal, round, and reactive to light. Conjunctivae and EOM are normal.  Neck: Normal range of motion.   Cardiovascular: Normal rate, regular rhythm and normal heart sounds.  Pulmonary/Chest: Effort normal and breath sounds normal.  Abdominal: Soft. Bowel sounds are normal. There is no tenderness. There is no rigidity and no guarding.  No focal abdominal or CVA tenderness  Genitourinary:  Genitourinary Comments: deferred  Musculoskeletal: Normal range of motion.  Neurological: He is alert and oriented to person, place, and time.  Skin: Skin is warm and dry.  Psychiatric: He has a normal mood and affect.  Nursing note and vitals reviewed.    ED Treatments / Results  Labs (all labs ordered are listed, but only abnormal results are displayed) Labs Reviewed  URINALYSIS, ROUTINE W REFLEX MICROSCOPIC - Abnormal; Notable for the following components:      Result Value   Glucose, UA 50 (*)    Hgb urine dipstick MODERATE (*)    Ketones, ur 20 (*)    Protein, ur 30 (*)    All other components within normal limits  COMPREHENSIVE METABOLIC PANEL - Abnormal; Notable for the following components:   Sodium 130 (*)    CO2 19 (*)    Glucose, Bld 169 (*)    Calcium 8.7 (*)    Albumin 3.4 (*)    All other components within normal limits  CBC WITH DIFFERENTIAL/PLATELET - Abnormal; Notable for the following components:   Neutro Abs 7.9 (*)    Lymphs Abs 0.5 (*)    All other components within normal limits  URINE CULTURE  CULTURE, BLOOD (ROUTINE X 2)  CULTURE, BLOOD (ROUTINE X 2)  LIPASE, BLOOD  I-STAT CG4 LACTIC ACID, ED    EKG None  Radiology Ct Renal Stone Study  Result Date: 02/15/2018 CLINICAL DATA:  52 year old male with flank pain and fever. EXAM: CT ABDOMEN AND PELVIS WITHOUT CONTRAST TECHNIQUE: Multidetector CT imaging of the abdomen and pelvis was performed following the standard protocol without IV contrast. COMPARISON:  CT of the abdomen pelvis dated 10/12/2015 FINDINGS: Evaluation of this exam is limited in the absence of intravenous contrast. Lower chest: Partially visualized  area of density in the lingula concerning for pneumonia. Clinical correlation is recommended. No intra-abdominal free air or free fluid. Hepatobiliary: Diffuse fatty liver. There multiple stones within the gallbladder. No pericholecystic fluid. Pancreas: Unremarkable. No pancreatic ductal dilatation or surrounding inflammatory changes. Spleen: Mildly enlarged spleen measuring 15 cm in craniocaudal length. Adrenals/Urinary Tract: The adrenal glands are unremarkable. The kidneys, visualized ureters, and urinary bladder appear unremarkable as well. Stomach/Bowel: There is no bowel obstruction or active inflammation. Normal appendix. Vascular/Lymphatic: Mild aortoiliac atherosclerotic disease. No portal venous  gas. There is no adenopathy. Reproductive: The prostate and seminal vesicles are grossly unremarkable. No pelvic mass. Other: None Musculoskeletal: Lower lumbar laminectomy and posterior fusion. No acute osseous pathology. IMPRESSION: 1. Findings concerning for lingular pneumonia. Clinical correlation is recommended. 2. No acute intra-abdominopelvic pathology. No hydronephrosis or nephrolithiasis. 3. Constipation. No bowel obstruction or active inflammation. Normal appendix. 4. Fatty liver and cholelithiasis. Electronically Signed   By: Anner Crete M.D.   On: 02/15/2018 21:59   US Scrotum W/doppler  Result Date: 02/16/2018 CLINICAL DATA:  52 year old male with right testicular pain. EXAM: SCROTAL ULTRASOUND DOPPLER ULTRASOUND OF THE TESTICLES TECHNIQUE: Complete ultrasound examination of the testicles, epididymis, and other scrotal structures was performed. Color and spectral Doppler ultrasound were also utilized to evaluate blood flow to the testicles. COMPARISON:  CT of the abdomen pelvis dated 02/15/2018 FINDINGS: Right testicle Measurements: 4.9 x 2.5 x 3.3 cm. No mass or microlithiasis visualized. Left testicle Measurements: 3.6 x 2.9 x 3.0 cm. No mass or microlithiasis visualized. Right epididymis:   Normal in size and appearance. Left epididymis: Normal in size and appearance. There is a 9 x 7 x 9 mm left epididymal head cyst. Hydrocele: Small bilateral hydroceles. Low-level echogenic debris within the left hydrocele. Varicocele: Moderate left-sided varicoceles. Pulsed Doppler interrogation of both testes demonstrates normal low resistance arterial and venous waveforms bilaterally. IMPRESSION: 1. Unremarkable testicles with Doppler detected flow bilaterally. 2. Bilateral hydroceles mildly complex on the left. 3. Left-sided varicoceles. Electronically Signed   By: Anner Crete M.D.   On: 02/16/2018 02:33    Procedures Procedures (including critical care time)  Medications Ordered in ED Medications  acetaminophen (TYLENOL) tablet 1,000 mg (1,000 mg Oral Given 02/15/18 2122)  ondansetron (ZOFRAN-ODT) disintegrating tablet 4 mg (4 mg Oral Given 02/15/18 2122)  ibuprofen (ADVIL,MOTRIN) tablet 800 mg (800 mg Oral Given 02/16/18 0328)     Initial Impression / Assessment and Plan / ED Course  I have reviewed the triage vital signs and the nursing notes.  Pertinent labs & imaging results that were available during my care of the patient were reviewed by me and considered in my medical decision making (see chart for details).  52 year old male here with fever and flank pain.  Seen by PCP yesterday and treated for possible kidney stone.  Initially febrile here but nontoxic in appearance.  Fever has reduced and heart rate normal after Tylenol.  Labs overall reassuring.  UA does have blood noted but no signs of infection.  CT renal study obtained without evidence of urinary calculus, however is note of lingular pneumonia.  Patient does report mild cough recently.  However, continues reporting some pain in the right testicle without any trauma or noted swelling.  No discharge, new sexual partner, or concern for STD.  Ultrasound obtained revealing hydrocele and varicocele but no evidence of epididymitis or  torsion.   Discussed with family it is possible he had a small kidney stone that has already passed causing some continued hematuria and renal colic.   Patient has remained hemodynamically stable here in the ED, non-toxic in appearance.  No oxygen requirement, appears comfortable.  Feel he is stable for OP management. Will be treated with antibiotics for lingular pneumonia seen on CT.  States he has pain meds at home from PCP he can use.  Continue fever control with tylenol/motrin.  Given follow-up with urology if any ongoing issues with testicle pain/hematuria.  Also recommended close follow-up with PCP.  He will return here for any new/acute changes.  Final Clinical Impressions(s) / ED Diagnoses   Final diagnoses:  Testicle pain  Flank pain  Hematuria, unspecified type    ED Discharge Orders         Ordered       02/16/18 0320    azithromycin (ZITHROMAX) 250 MG tablet  Daily     02/16/18 0321           Larene Pickett, PA-C 02/16/18 0359    Larene Pickett, PA-C 02/16/18 0400    Veryl Speak, MD 02/16/18 [phone]

## 2018-02-16 NOTE — ED Notes (Signed)
Patient transported to Ultrasound 

## 2018-02-16 NOTE — ED Notes (Signed)
ED Provider at bedside. 

## 2018-02-17 LAB — URINE CULTURE: Culture: <10000 — AB

## 2018-02-20 LAB — CULTURE, BLOOD (ROUTINE X 2)
Culture: NO GROWTH
Culture: NO GROWTH
Special Requests: ADEQUATE

## 2018-03-16 MED FILL — LATANOPROST 0.005% EYE DRP: 0.005 | 25 days supply | Qty: 3 | Fill #0

## 2018-04-16 MED FILL — LATANOPROST 0.005% EYE DRP: 0.005 | 25 days supply | Qty: 3 | Fill #1

## 2018-05-14 NOTE — Telephone Encounter (Signed)
Following up on open encounter.

## 2018-08-24 ENCOUNTER — Encounter: Payer: No Typology Code available for payment source | Admitting: Family Medicine

## 2018-12-28 ENCOUNTER — Other Ambulatory Visit: Payer: Self-pay

## 2018-12-28 ENCOUNTER — Encounter: Payer: Self-pay | Admitting: Family Medicine

## 2018-12-28 ENCOUNTER — Ambulatory Visit (INDEPENDENT_AMBULATORY_CARE_PROVIDER_SITE_OTHER): Payer: No Typology Code available for payment source | Admitting: Family Medicine

## 2018-12-28 VITALS — BP 128/78 | HR 71 | Temp 98.0°F | Ht 71.0 in | Wt 268.1 lb

## 2018-12-28 DIAGNOSIS — Z23 Encounter for immunization: Secondary | ICD-10-CM

## 2018-12-28 DIAGNOSIS — F17210 Nicotine dependence, cigarettes, uncomplicated: Secondary | ICD-10-CM | POA: Diagnosis not present

## 2018-12-28 DIAGNOSIS — Z125 Encounter for screening for malignant neoplasm of prostate: Secondary | ICD-10-CM | POA: Diagnosis not present

## 2018-12-28 DIAGNOSIS — Z0000 Encounter for general adult medical examination without abnormal findings: Secondary | ICD-10-CM

## 2018-12-28 LAB — CBC
HCT: 46.9 % (ref 39.0–52.0)
Hemoglobin: 16 g/dL (ref 13.0–17.0)
MCHC: 34.2 g/dL (ref 30.0–36.0)
MCV: 88.4 fl (ref 78.0–100.0)
Platelets: 201 10*3/uL (ref 150.0–400.0)
RBC: 5.3 Mil/uL (ref 4.22–5.81)
RDW: 13.6 % (ref 11.5–15.5)
WBC: 6.8 10*3/uL (ref 4.0–10.5)

## 2018-12-28 LAB — COMPREHENSIVE METABOLIC PANEL
ALT: 29 U/L (ref 0–53)
AST: 16 U/L (ref 0–37)
Albumin: 4.3 g/dL (ref 3.5–5.2)
Alkaline Phosphatase: 54 U/L (ref 39–117)
BUN: 15 mg/dL (ref 6–23)
CO2: 25 mEq/L (ref 19–32)
Calcium: 9.3 mg/dL (ref 8.4–10.5)
Chloride: 107 mEq/L (ref 96–112)
Creatinine, Ser: 0.84 mg/dL (ref 0.40–1.50)
GFR: 94.66 mL/min (ref >60.00)
Glucose, Bld: 89 mg/dL (ref 70–99)
Potassium: 4 mEq/L (ref 3.5–5.1)
Sodium: 140 mEq/L (ref 135–145)
Total Bilirubin: 0.6 mg/dL (ref 0.2–1.2)
Total Protein: 6.8 g/dL (ref 6.0–8.3)

## 2018-12-28 LAB — LIPID PANEL
Cholesterol: 166 mg/dL (ref 0–200)
HDL: 34.3 mg/dL — ABNORMAL LOW (ref >39.00)
LDL Cholesterol: 113 mg/dL — ABNORMAL HIGH (ref 0–99)
NonHDL: 131.93
Total CHOL/HDL Ratio: 5
Triglycerides: 96 mg/dL (ref 0.0–149.0)
VLDL: 19.2 mg/dL (ref 0.0–40.0)

## 2018-12-28 LAB — PSA: PSA: 0.41 ng/mL (ref 0.10–4.00)

## 2018-12-28 NOTE — Patient Instructions (Signed)
Give [place] 2-3 business days to get the results of your labs back.   Keep the diet clean and stay active.  Consider getting flu shot in mid October.   Let us know if you need anything.

## 2018-12-28 NOTE — Progress Notes (Signed)
CC: Well visit  Well Male [name] is here for a complete physical.   His last physical was >1 year ago.  Current diet: in general, a "healthy" diet.  Current exercise: well visit Weight trend: stable Does pt snore? No. Daytime fatigue? No. Seat belt? Yes.    Health maintenance Shingrix- No Colonoscopy- Yes Tetanus- Yes HIV- Yes Hep C- Yes   Past Medical History:  Diagnosis Date  . Diabetes mellitus without complication (HCC)    hx- borderline- diet controlled   . GERD (gastroesophageal reflux disease)   . Headache   . Hyperlipidemia   . Obesity   . Tobacco use       Past Surgical History:  Procedure Laterality Date  . BACK SURGERY  02/2008  . WISDOM TOOTH EXTRACTION      Medications  Takes no meds routinely.  Allergies No Known Allergies  Family History Family History  Problem Relation Age of Onset  . Diabetes Mother   . Cancer Father 46       prostate  . Prostate cancer Father   . Diabetes Sister   . Heart disease Sister 18       MI, pacemaker  . Other Brother        died in motorcycle accident  . Bladder Cancer Brother   . Colon cancer Neg Hx   . Colon polyps Neg Hx   . Rectal cancer Neg Hx   . Stomach cancer Neg Hx     Review of Systems: Constitutional:  no fevers Eye:  no recent significant change in vision Ear/Nose/Mouth/Throat:  Ears:  no hearing loss Nose/Mouth/Throat:  no complaints of nasal congestion, no sore throat Cardiovascular:  no chest pain, no palpitations Respiratory:  no cough and no shortness of breath Gastrointestinal:  no abdominal pain, no change in bowel habits GU:  Male: negative for dysuria, frequency, and incontinence and negative for prostate symptoms Musculoskeletal/Extremities: +chronic low back pain; otherwise no pain, redness, or swelling of the joints Integumentary (Skin/Breast):  no abnormal skin lesions reported Neurologic:  no headaches Endocrine: No unexpected weight changes Hematologic/Lymphatic:   no abnormal bleeding  Exam BP 128/78 (BP Location: Left Arm, Patient Position: Sitting, Cuff Size: Normal)   Pulse 71   Temp 98 F (36.7 C) (Oral)   Ht 5\' 11"  (1.803 m)   Wt 268 lb 2 oz (121.6 kg)   SpO2 97%   BMI 37.40 kg/m  General:  well developed, well nourished, in no apparent distress Skin:  no significant moles, warts, or growths Head:  no masses, lesions, or tenderness Eyes:  pupils equal and round, sclera anicteric without injection Ears:  canals without lesions, TMs shiny without retraction, no obvious effusion, no erythema Nose:  nares patent, septum midline, mucosa normal Throat/Pharynx:  lips and gingiva without lesion; tongue and uvula midline; non-inflamed pharynx; no exudates or postnasal drainage Neck: neck supple without adenopathy, thyromegaly, or masses Lungs:  clear to auscultation, breath sounds equal bilaterally, no respiratory distress Cardio:  regular rate and rhythm, no LE edema, no bruits Abdomen:  abdomen soft, nontender; bowel sounds normal; no masses or organomegaly Genital (male): circumcised penis, no lesions or discharge; testes present bilaterally without masses or tenderness Rectal: Deferred Musculoskeletal:  symmetrical muscle groups noted without atrophy or deformity Extremities:  no clubbing, cyanosis, or edema, no deformities, no skin discoloration Neuro:  gait normal; deep tendon reflexes normal and symmetric Psych: well oriented with normal range of affect and appropriate judgment/insight  Assessment and Plan  Well adult exam - Plan: Lipid panel, CBC, Comprehensive metabolic panel  Screening for malignant neoplasm of prostate - Plan: PSA  Smokes with greater than 30 pack year history - Plan: CT CHEST LUNG CANCER SCREENING LOW DOSE WO CONTRAST   Well 53 y.o. male. Counseled on diet and exercise. Counseled on risks and benefits of prostate cancer screening with PSA. The patient agrees to undergo testing. PCV23 and Shingrix today.   Immunizations, labs, and further orders as above. Follow up in 2-6 mo for 2nd Shingrix and 1 yr for CPE. The patient voiced understanding and agreement to the plan.  [place], DO 12/28/18 1:42 PM

## 2019-01-29 ENCOUNTER — Encounter: Payer: Self-pay | Admitting: Family Medicine

## 2019-01-30 NOTE — Telephone Encounter (Signed)
Message has been routed to [facility] and referral coordinator.

## 2019-02-01 ENCOUNTER — Other Ambulatory Visit: Payer: Self-pay

## 2019-02-01 ENCOUNTER — Ambulatory Visit (HOSPITAL_BASED_OUTPATIENT_CLINIC_OR_DEPARTMENT_OTHER)
Admission: RE | Admit: 2019-02-01 | Discharge: 2019-02-01 | Disposition: A | Discharge status: Home / Self Care | Payer: No Typology Code available for payment source | Source: Ambulatory Visit | Attending: Family Medicine | Admitting: Family Medicine

## 2019-02-01 DIAGNOSIS — F17210 Nicotine dependence, cigarettes, uncomplicated: Secondary | ICD-10-CM | POA: Diagnosis present

## 2019-02-21 ENCOUNTER — Telehealth: Payer: Self-pay

## 2019-02-21 NOTE — Telephone Encounter (Signed)
Copied from [place] [phone]. Topic: Appointment Scheduling - Scheduling Inquiry for Clinic >> Feb 21, 2019  1:02 PM Virl Axe D wrote: Reason for CRM: Pt's wife called to schedule appt for pt for today or tomorrow 02/22/19 for foot pain. Please advise.   Spoke with wife Santiago Glad regarding symptoms.  She reports patient has painful, swollen knot on the back of his foot (could not remember which foot) that is causing pain to walk now. Wife states this was mentioned at CPE in August, but has not improved. Has taken Advil with minimal relief.  Scheduled to see PCP on 02/22/2019.

## 2019-02-22 ENCOUNTER — Encounter: Payer: Self-pay | Admitting: Family Medicine

## 2019-02-22 ENCOUNTER — Ambulatory Visit (INDEPENDENT_AMBULATORY_CARE_PROVIDER_SITE_OTHER): Payer: No Typology Code available for payment source | Admitting: Family Medicine

## 2019-02-22 ENCOUNTER — Other Ambulatory Visit: Payer: Self-pay

## 2019-02-22 VITALS — BP 120/78 | HR 78 | Temp 98.2°F | Ht 71.0 in | Wt 277.1 lb

## 2019-02-22 DIAGNOSIS — M7662 Achilles tendinitis, left leg: Secondary | ICD-10-CM

## 2019-02-22 MED ORDER — MELOXICAM 15 MG PO TABS: 15.0000 mg | ORAL_TABLET | Freq: Every day | ORAL | 0 refills | Status: DC

## 2019-02-22 MED FILL — MELOXICAM 15 MG TABLET: 15 | 30 days supply | Qty: 30 | Fill #0

## 2019-02-22 NOTE — Patient Instructions (Addendum)
Ice/cold pack over area for 10-15 min twice daily.  OK to take Tylenol 1000 mg (2 extra strength tabs) or 975 mg (3 regular strength tabs) every 6 hours as needed.  Wear the boot when you are walking.  Activity as tolerated.  Cancel appt if you are doing better.   Achilles Tendinitis Rehab It is normal to feel mild stretching, pulling, tightness, or discomfort as you do these exercises, but you should stop right away if you feel sudden pain or your pain gets worse.   Stretching and range of motion exercises These exercises warm up your muscles and joints and improve the movement and flexibility of your ankle. These exercises also help to relieve pain, numbness, and tingling. Exercise A: Standing wall calf stretch, knee straight  1. Stand with your hands against a wall. 2. Extend your affected leg behind you and bend your front knee slightly. Keep both of your heels on the floor. 3. Point the toes of your back foot slightly inward. 4. Keeping your heels on the floor and your back knee straight, shift your weight toward the wall. Do not allow your back to arch. You should feel a gentle stretch in your calf. 5. Hold this position for seconds. Repeat 2 times. Complete this stretch 3 times per week Exercise B: Standing wall calf stretch, knee bent 1. Stand with your hands against a wall. 2. Extend your affected leg behind you, and bend your front knee slightly. Keep both of your heels on the floor. 3. Point the toes of your back foot slightly inward. 4. Keeping your heels on the floor, unlock your back knee so that it is bent. You should feel a gentle stretch deep in your calf. 5. Hold this position for 30 seconds. Repeat 2 times. Complete this stretch 3 times per week.  Strengthening exercises These exercises build strength and control of your ankle. Endurance is the ability to use your muscles for a long time, even after they get tired. Exercise C: Plantar flexion with band  1. Sit on  the floor with your affected leg extended. You may put a pillow under your calf to give your foot more room to move. 2. Loop a rubber exercise band or tube around the ball of your affected foot. The ball of your foot is on the walking surface, right under your toes. The band or tube should be slightly tense when your foot is relaxed. If the band or tube slips, you can put on your shoe or put a washcloth between the band and your foot to help it stay in place. 3. Slowly point your toes downward, pushing them away from you. 4. Hold this position for 10 seconds. 5. Slowly release the tension in the band or tube, controlling smoothly until your foot is back to the starting position. Repeat 2 times. Complete this exercise 3 times per week. Exercise D: Heel raise with eccentric lower  1. Stand on a step with the balls of your feet. The ball of your foot is on the walking surface, right under your toes. ? Do not put your heels on the step. ? For balance, rest your hands on the wall or on a railing. 2. Rise up onto the balls of your feet. 3. Keeping your heels up, shift all of your weight to your affected leg and pick up your other leg. 4. Slowly lower your affected leg so your heel drops below the level of the step. 5. Put down your foot. If  told by your health care provider, build up to:  3 sets of 15 repetitions while keeping your knees straight.  3 sets of 15 repetitions while keeping your knees bent as far as told by your health care provider.  Complete this exercise 3 times per week. If this exercise is too easy, try doing it while wearing a backpack with weights in it. Balance exercises These exercises improve or maintain your balance. Balance is important in preventing falls. Exercise E: Single leg stand 1. Without shoes, stand near a railing or in a door frame. Hold on to the railing or door frame as needed. 2. Stand on your affected foot. Keep your big toe down on the floor and try to  keep your arch lifted. 3. Hold this position for 10 seconds. Repeat 2 times. Complete this exercise 3 times per week. If this exercise is too easy, you can try it with your eyes closed or while standing on a pillow. Make sure you discuss any questions you have with your health care provider. Document Released: 12/08/2004 Document Revised: 01/14/2016 Document Reviewed: 01/13/2015 Elsevier Interactive Patient Education  [employer].

## 2019-02-22 NOTE — Progress Notes (Signed)
Musculoskeletal Exam  Patient: [name] DOB: 15-Sep-1965  DOS: 02/22/2019  SUBJECTIVE:  Chief Complaint:   Chief Complaint  Patient presents with  . Ankle Pain    left ankle knot    [name] is a 53 y.o.  male for evaluation and treatment of L ankle pain.   Onset:  3 months ago. No inj or change in activity.  Location: back of L heel Character:  aching and sharp  Progression of issue:  has worsened Associated symptoms: knot over area Treatment: to date has been OTC NSAIDS.   Neurovascular symptoms: no  ROS: Musculoskeletal/Extremities: +L heel pain  Past Medical History:  Diagnosis Date  . Diabetes mellitus without complication (HCC)    hx- borderline- diet controlled   . GERD (gastroesophageal reflux disease)   . Headache   . Hyperlipidemia   . Obesity   . Tobacco use     Objective: VITAL SIGNS: BP 120/78 (BP Location: Left Arm, Patient Position: Sitting, Cuff Size: Large)   Pulse 78   Temp 98.2 F (36.8 C) (Temporal)   Ht 5\' 11"  (1.803 m)   Wt 277 lb 2 oz (125.7 kg)   SpO2 97%   BMI 38.65 kg/m  Constitutional: Well formed, well developed. No acute distress. Cardiovascular: Brisk cap refill Thorax & Lungs: No accessory muscle use Musculoskeletal: Calcaneal tendon.   Normal active range of motion: yes.   Normal passive range of motion: yes Tenderness to palpation: Yes, just above the calcaneal insertion Deformity: yes Ecchymosis: no Neurologic: Normal sensory function.  Psychiatric: Normal mood. Age appropriate judgment and insight. Alert & oriented x 3.    Assessment:  Achilles tendinitis of left lower extremity - Plan: meloxicam (MOBIC) 15 MG tablet  Plan: Orders as above. Tylenol, ice, needs to be in a CAM walker to help walking, stretches/exercises. F/u in 4 weeks. The patient voiced understanding and agreement to the plan.   [place], DO 02/22/19  3:26 PM

## 2019-03-21 ENCOUNTER — Other Ambulatory Visit: Payer: Self-pay

## 2019-03-22 ENCOUNTER — Encounter: Payer: Self-pay | Admitting: Family Medicine

## 2019-03-22 ENCOUNTER — Ambulatory Visit (INDEPENDENT_AMBULATORY_CARE_PROVIDER_SITE_OTHER): Payer: No Typology Code available for payment source | Admitting: Family Medicine

## 2019-03-22 VITALS — BP 132/72 | HR 84 | Temp 98.0°F | Ht 71.0 in | Wt 282.1 lb

## 2019-03-22 DIAGNOSIS — M7662 Achilles tendinitis, left leg: Secondary | ICD-10-CM | POA: Diagnosis not present

## 2019-03-22 DIAGNOSIS — Z23 Encounter for immunization: Secondary | ICD-10-CM

## 2019-03-22 MED ORDER — PREDNISONE 20 MG PO TABS: 40.0000 mg | ORAL_TABLET | Freq: Every day | ORAL | 0 refills | Status: AC

## 2019-03-22 MED ORDER — NITROGLYCERIN 0.2 MG/HR TD PT24: MEDICATED_PATCH | TRANSDERMAL | 1 refills | Status: DC

## 2019-03-22 MED FILL — predniSONE 20 MG TABS: 20 | 5 days supply | Qty: 10 | Fill #0

## 2019-03-22 MED FILL — NITROGLYCERIN 0.2 MG/HR PTC: 0.2 | 30 days supply | Qty: 30 | Fill #0

## 2019-03-22 NOTE — Patient Instructions (Signed)
Ice/cold pack over area for 10-15 min twice daily.  If you do not hear anything about your referral in the next 1-2 weeks, call our office and ask for an update.  Continue wearing the brace.  Don't take Mobic while on the prednisone.   Continue the stretches in the meanwhile.   Let us know if you need anything.

## 2019-03-22 NOTE — Addendum Note (Signed)
Addended by: Sharon Seller B on: 03/22/2019 01:09 PM   Modules accepted: Orders

## 2019-03-22 NOTE — Progress Notes (Signed)
Chief Complaint  Patient presents with  . Follow-up    left foot pain    Subjective: Patient is a 53 y.o. male here for f/u heel pain.  Patient was seen around 4 weeks ago and diagnosed with Achilles tendinitis.  He was given Mobic, instructed to ice and do home stretches/exercises in addition to be placed in a cam walker boot.  Since that time, he reports little improvement. Compliant w recs.   ROS: MSK: +heel pain  Past Medical History:  Diagnosis Date  . Diabetes mellitus without complication (HCC)    hx- borderline- diet controlled   . GERD (gastroesophageal reflux disease)   . Headache   . Hyperlipidemia   . Obesity   . Tobacco use     Objective: BP 140/74 (BP Location: Left Arm, Patient Position: Sitting, Cuff Size: Normal)   Pulse 84   Temp 98 F (36.7 C) (Temporal)   Ht 5\' 11"  (1.803 m)   Wt 282 lb 2 oz (128 kg)   SpO2 96%   BMI 39.35 kg/m  General: Awake, appears stated age MSK: +fusiform deformity over L achilles tendon, +TTP over same location Neuro: Antalgic gait Lungs: No accessory muscle use Psych: Age appropriate judgment and insight, normal affect and mood  Assessment and Plan: Achilles tendinitis of left lower extremity - Plan: nitroGLYCERIN (NITRODUR - DOSED IN MG/24 HR) 0.2 mg/hr patch, predniSONE (DELTASONE) 20 MG tablet, Ambulatory referral to Physical Therapy  Cont stretches/exercises, PT, ice, Tylenol. Hold NSAIDs while on pred burst. Nitro patches.  Cont to wear boot.  F/u prn.  The patient voiced understanding and agreement to the plan.  Hasley Canyon, DO 03/22/19  1:01 PM

## 2019-03-29 ENCOUNTER — Encounter: Payer: Self-pay | Admitting: Physical Therapy

## 2019-03-29 ENCOUNTER — Other Ambulatory Visit: Payer: Self-pay

## 2019-03-29 ENCOUNTER — Ambulatory Visit: Payer: No Typology Code available for payment source | Attending: Family Medicine | Admitting: Physical Therapy

## 2019-03-29 DIAGNOSIS — R262 Difficulty in walking, not elsewhere classified: Secondary | ICD-10-CM | POA: Diagnosis present

## 2019-03-29 DIAGNOSIS — M25572 Pain in left ankle and joints of left foot: Secondary | ICD-10-CM | POA: Diagnosis not present

## 2019-03-29 DIAGNOSIS — M25672 Stiffness of left ankle, not elsewhere classified: Secondary | ICD-10-CM | POA: Diagnosis present

## 2019-03-29 DIAGNOSIS — R29898 Other symptoms and signs involving the musculoskeletal system: Secondary | ICD-10-CM | POA: Diagnosis present

## 2019-03-29 NOTE — Therapy (Signed)
[facility] [address]  [place], [place], [address] Phone: [phone]   Fax:  [phone]  Physical Therapy Evaluation  Patient Details  Name: [name] MRN: [id number] Date of Birth: [birth year] Referring Provider (PT): Crosby Oyster [place], [place]   Encounter Date: 03/29/2019  PT End of Session - 03/29/19 0918    Visit Number  1    Number of Visits  7    Date for PT Re-Evaluation  05/10/19    Authorization Type  [facility]    PT Start Time  0843    PT Stop Time  0917    PT Time Calculation (min)  34 min    Activity Tolerance  Patient tolerated treatment well    Behavior During Therapy  [facility] for tasks assessed/performed       Past Medical History:  Diagnosis Date  . Diabetes mellitus without complication (HCC)    hx- borderline- diet controlled   . GERD (gastroesophageal reflux disease)   . Headache   . Hyperlipidemia   . Obesity   . Tobacco use     Past Surgical History:  Procedure Laterality Date  . BACK SURGERY  02/2008  . WISDOM TOOTH EXTRACTION      There were no vitals filed for this visit.   Subjective Assessment - 03/29/19 0844    Subjective  Patient reports pain in L heel for a couple months. Notes no trauma or activity change. Started to notice a bump on the back of his heel over time which was tender. Pain is located over mid-Achilles tendon. Denies N/T. Worse with stairs and ladders, prolonged walking, worse in the afternoons. Better with meds and stretches. Has been wearing cam boot for 1 month and the pain is getting better. Wears the boot for long distance walking and at work.    Pertinent History  HLD, HA, GERD, DM, back surgery 2009    Limitations  Standing;Walking;House hold activities    How long can you stand comfortably?  1-1.5 hours    How long can you walk comfortably?  30 min    Patient Stated Goals  not be in pain    Currently in Pain?  Yes    Pain Score  0-No pain    Pain  Location  Heel    Pain Orientation  Left;Posterior    Pain Descriptors / Indicators  Constant    Pain Type  Chronic pain         [facility] PT Assessment - 03/29/19 0850      Assessment   Medical Diagnosis  Achilles Tendinitis of L LE    Referring Provider (PT)  Shelda Pal, DO    Onset Date/Surgical Date  12/27/18    Next MD Visit  next year for physical    Prior Therapy  no      Precautions   Precautions  --   cam boot for long distances     Balance Screen   Has the patient fallen in the past 6 months  No    Has the patient had a decrease in activity level because of a fear of falling?   No    Is the patient reluctant to leave their home because of a fear of falling?   No      Home Environment   Living Environment  Private residence    Living Arrangements  Spouse/significant other    Available Help at Discharge  Family  Type of Tea to enter    Entrance Stairs-Number of Steps  5    Entrance Stairs-Rails  Right;Left    Home Layout  One level    Home Equipment  Crutches      Prior Function   Level of Independence  Independent    Vocation  Full time employment    Designer, television/film set- climbing ladders, walking    Leisure  none      Cognition   Overall Cognitive Status  Within Functional Limits for tasks assessed      Observation/Other Assessments   Observations  L foot in cam boot      Sensation   Light Touch  Appears Intact      Coordination   Gross Motor Movements are Fluid and Coordinated  Yes      Posture/Postural Control   Posture/Postural Control  Postural limitations    Postural Limitations  Rounded Shoulders;Forward head;Posterior pelvic tilt;Weight shift right      ROM / Strength   AROM / PROM / Strength  AROM;Strength      AROM   AROM Assessment Site  Ankle    Right/Left Ankle  Left;Right    Right Ankle Dorsiflexion  2    Right Ankle Plantar Flexion  44    Right Ankle Inversion  20    Right Ankle  Eversion  15    Left Ankle Dorsiflexion  6    Left Ankle Plantar Flexion  35    Left Ankle Inversion  38    Left Ankle Eversion  22      Strength   Strength Assessment Site  Hip;Knee;Ankle    Right/Left Hip  Right;Left    Right Hip Flexion  4+/5    Right Hip ABduction  4+/5    Right Hip ADduction  4+/5    Left Hip Flexion  4+/5    Left Hip ABduction  4+/5    Left Hip ADduction  4+/5    Right/Left Knee  Right;Left    Right Knee Flexion  3+/5    Right Knee Extension  4+/5    Left Knee Flexion  3+/5    Left Knee Extension  4+/5    Right/Left Ankle  Right;Left    Right Ankle Dorsiflexion  4+/5    Right Ankle Plantar Flexion  4/5   tested in sitting- unable to test in standing d/t shoes   Right Ankle Inversion  4/5    Right Ankle Eversion  4+/5    Left Ankle Dorsiflexion  4+/5    Left Ankle Plantar Flexion  4/5   tested in sitting- unable to test in standing d/t shoes   Left Ankle Inversion  4/5    Left Ankle Eversion  4+/5      Palpation   Palpation comment  TTP over L medial achilles tendon with small protrusion/accumulation of edema present at midlength of tendon; mild-mod soft tissue restriction in L calf      Ambulation/Gait   Assistive device  None   cam boot L LE   Gait Pattern  Step-to pattern;Step-through pattern;Decreased stance time - left;Decreased step length - right;Decreased weight shift to left;Lateral trunk lean to right;Lateral trunk lean to left;Antalgic    Ambulation Surface  Level;Indoor    Gait velocity  decreased                Objective measurements completed on examination: See above findings.  PT Education - 03/29/19 0917    Education Details  prognosis, POC, HEP    Person(s) Educated  Patient    Methods  Explanation;Demonstration;Tactile cues;Verbal cues;Handout    Comprehension  Verbalized understanding;Returned demonstration       PT Short Term Goals - 03/29/19 [id number]      PT SHORT TERM GOAL #1   Title   Patient to be independent with initial HEP.    Time  3    Period  Weeks    Status  New    Target Date  04/19/19        PT Long Term Goals - 03/29/19 0929      PT LONG TERM GOAL #1   Title  Patient to be independent with advanced HEP.    Time  6    Period  Weeks    Status  New    Target Date  05/10/19      PT LONG TERM GOAL #2   Title  Patient to demonstrate L ankle AROM WFL and pain-free.    Time  6    Period  Weeks    Status  New    Target Date  05/10/19      PT LONG TERM GOAL #3   Title  Patient to demonstrate B ankle strength >=4+/5.    Time  6    Period  Weeks    Status  New    Target Date  05/10/19      PT LONG TERM GOAL #4   Title  Patient to report tolerance of climbing ladders and stairs at work without pain limiting.    Time  6    Period  Weeks    Status  New    Target Date  05/10/19             Plan - 03/29/19 [id number]    Clinical Impression Statement  Patient is a 53y/o M presenting to OPPT with c/o L Achilles pain of insidious onset for the past couple of months. Pain is located over mid-Achilles tendon with tender Haglund's deformity. Worse with stairs and ladders, prolonged walking, and in the afternoons. Patient today presenting with slight decrease in B ankle strength, decreased B ankle AROM, TTP and edema over L medial Achilles tendon and soft tissue restriction over L calf, and gait deviations. Patient educated on gentle strengthening and stretching HEP and reported understanding. Would benefit from skilled PT services 1x/week for 6 weeks to address aforementioned impairments.    Personal Factors and Comorbidities  Age;Comorbidity 3+;Time since onset of injury/illness/exacerbation;Fitness;Past/Current Experience;Profession    Comorbidities  HLD, HA, GERD, DM, back surgery 2009    Examination-Activity Limitations  Stairs;Carry;Stand;Lift;Locomotion Level;Transfers    Examination-Participation Restrictions  Church;Cleaning;Shop;Community Activity;Yard  Work;Interpersonal Relationship;Laundry;Meal Prep    Stability/Clinical Decision Making  Stable/Uncomplicated    Clinical Decision Making  Low    Rehab Potential  Good    PT Frequency  1x / week    PT Duration  6 weeks    PT Treatment/Interventions  ADLs/Self Care Home Management;Cryotherapy;Electrical Stimulation;Iontophoresis 4mg /ml Dexamethasone;Moist Heat;Balance training;Therapeutic exercise;Therapeutic activities;Functional mobility training;Stair training;Gait training;Ultrasound;Neuromuscular re-education;Patient/family education;Orthotic Fit/Training;Manual techniques;Vasopneumatic Device;Taping;Energy conservation;Dry needling;Passive range of motion    PT Next Visit Plan  reassess HEP; FOTO    Consulted and Agree with Plan of Care  Patient       Patient will benefit from skilled therapeutic intervention in order to improve the following deficits and impairments:  Increased edema, Decreased activity tolerance, Decreased strength, Increased fascial  restricitons, Pain, Difficulty walking, Decreased balance, Decreased range of motion, Improper body mechanics, Postural dysfunction, Impaired flexibility  Visit Diagnosis: Pain in left ankle and joints of left foot  Stiffness of left ankle, not elsewhere classified  Difficulty in walking, not elsewhere classified  Other symptoms and signs involving the musculoskeletal system     Problem List Patient Active Problem List   Diagnosis Date Noted  . Erectile dysfunction 08/18/2017     Janene Harvey, PT, DPT 03/29/19 9:55 AM   [facility] [address]  [place], [place], [address] Phone: [phone]   Fax:  [phone]  Name: [name] MRN: [id number] Date of Birth: [birth year]

## 2019-04-05 ENCOUNTER — Other Ambulatory Visit: Payer: Self-pay

## 2019-04-05 ENCOUNTER — Ambulatory Visit: Payer: No Typology Code available for payment source

## 2019-04-05 DIAGNOSIS — M25572 Pain in left ankle and joints of left foot: Secondary | ICD-10-CM

## 2019-04-05 DIAGNOSIS — R29898 Other symptoms and signs involving the musculoskeletal system: Secondary | ICD-10-CM

## 2019-04-05 DIAGNOSIS — M25672 Stiffness of left ankle, not elsewhere classified: Secondary | ICD-10-CM

## 2019-04-05 DIAGNOSIS — R262 Difficulty in walking, not elsewhere classified: Secondary | ICD-10-CM

## 2019-04-05 NOTE — Therapy (Signed)
[facility] [address]  [place], [place], [address] Phone: [phone]   Fax:  [phone]  Physical Therapy Treatment  Patient Details  Name: [name] MRN: [id number] Date of Birth: November 29, 1965 Referring Provider (PT): Crosby Oyster [place], [place]   Encounter Date: 04/05/2019  PT End of Session - 04/05/19 0851    Visit Number  2    Number of Visits  7    Date for PT Re-Evaluation  05/10/19    Authorization Type  [facility]    PT Start Time  0847    PT Stop Time  0930    PT Time Calculation (min)  43 min    Activity Tolerance  Patient tolerated treatment well    Behavior During Therapy  [facility] for tasks assessed/performed       Past Medical History:  Diagnosis Date  . Diabetes mellitus without complication (HCC)    hx- borderline- diet controlled   . GERD (gastroesophageal reflux disease)   . Headache   . Hyperlipidemia   . Obesity   . Tobacco use     Past Surgical History:  Procedure Laterality Date  . BACK SURGERY  02/2008  . WISDOM TOOTH EXTRACTION      There were no vitals filed for this visit.  Subjective Assessment - 04/05/19 0851    Subjective  Pt. doing ok today.    Pertinent History  HLD, HA, GERD, DM, back surgery 2009    Patient Stated Goals  not be in pain    Currently in Pain?  No/denies    Pain Score  0-No pain   up to a 4/10 pain at worst with prolonged walking   Pain Location  Heel    Pain Orientation  Left;Posterior    Pain Descriptors / Indicators  --   intermittent   Pain Type  Chronic pain    Multiple Pain Sites  No                       [facility] Adult PT Treatment/Exercise - 04/05/19 0001      Self-Care   Self-Care  Other Self-Care Comments    Other Self-Care Comments   discussion of appropriate ankle brace (see pt. education section for handout)      Ankle Exercises: Stretches   Plantar Fascia Stretch  30 seconds;3 reps    Plantar Fascia Stretch  Limitations  L on wall     Soleus Stretch  30 seconds;3 reps   discussed "pushing gently" to avoid pain    Soleus Stretch Limitations  B - into wall     Gastroc Stretch  30 seconds;3 reps    Gastroc Stretch Limitations  B - into wall     Other Stretch  Seated L ankle DF stretch with LE tucked under 2 x 30 sec       Ankle Exercises: Aerobic   Recumbent Bike  Lvl 1, 6 min       Ankle Exercises: Seated   Other Seated Ankle Exercises  4-way seated ankle red band x 15 reps each way    good understanding of 4-way ankle - cues for slow pacing      Ankle Exercises: Standing   Heel Raises  10 reps;Both;3 seconds    Heel Raises Limitations  at UBE with negative at bottom     Toe Raise  10 reps;3 seconds    Toe Raise Limitations  at counter  PT Education - 04/05/19 0922    Education Details  handout for recommended ankle brace    Person(s) Educated  Patient    Methods  Explanation;Verbal cues    Comprehension  Verbalized understanding;Verbal cues required       PT Short Term Goals - 04/05/19 0902      PT SHORT TERM GOAL #1   Title  Patient to be independent with initial HEP.    Time  3    Period  Weeks    Status  On-going    Target Date  04/19/19        PT Long Term Goals - 04/05/19 0902      PT LONG TERM GOAL #1   Title  Patient to be independent with advanced HEP.    Time  6    Period  Weeks    Status  On-going      PT LONG TERM GOAL #2   Title  Patient to demonstrate L ankle AROM WFL and pain-free.    Time  6    Period  Weeks    Status  On-going      PT LONG TERM GOAL #3   Title  Patient to demonstrate B ankle strength >=4+/5.    Time  6    Period  Weeks    Status  On-going      PT LONG TERM GOAL #4   Title  Patient to report tolerance of climbing ladders and stairs at work without pain limiting.    Time  6    Period  Weeks    Status  On-going            Plan - 04/05/19 1215    Clinical Impression Statement  Breyen doing well and  able to demo good HEP understanding with review today.  Tolerated progression of ankle strengthening and flexibility work well today.  Discussed appropriate ankle brace as pt. wanting to purchase (see pt. education section for details) as he feels he needs to wean out of CAM boot.  Ended visit with no increase in pain thus modalities deferred.    Personal Factors and Comorbidities  Age;Comorbidity 3+;Time since onset of injury/illness/exacerbation;Fitness;Past/Current Experience;Profession    Comorbidities  HLD, HA, GERD, DM, back surgery 2009    Rehab Potential  Good    PT Treatment/Interventions  ADLs/Self Care Home Management;Cryotherapy;Electrical Stimulation;Iontophoresis 4mg /ml Dexamethasone;Moist Heat;Balance training;Therapeutic exercise;Therapeutic activities;Functional mobility training;Stair training;Gait training;Ultrasound;Neuromuscular re-education;Patient/family education;Orthotic Fit/Training;Manual techniques;Vasopneumatic Device;Taping;Energy conservation;Dry needling;Passive range of motion    PT Next Visit Plan  FOTO    Consulted and Agree with Plan of Care  Patient       Patient will benefit from skilled therapeutic intervention in order to improve the following deficits and impairments:  Increased edema, Decreased activity tolerance, Decreased strength, Increased fascial restricitons, Pain, Difficulty walking, Decreased balance, Decreased range of motion, Improper body mechanics, Postural dysfunction, Impaired flexibility  Visit Diagnosis: Pain in left ankle and joints of left foot  Stiffness of left ankle, not elsewhere classified  Difficulty in walking, not elsewhere classified  Other symptoms and signs involving the musculoskeletal system     Problem List Patient Active Problem List   Diagnosis Date Noted  . Erectile dysfunction 08/18/2017    Bess Harvest, PTA 04/05/19 12:19 PM   [facility] [address]  [place], [place], [address] Phone: [phone]   Fax:  [phone]  Name: [name] MRN: [id number] Date of Birth:  07/11/1965   

## 2019-04-12 ENCOUNTER — Encounter: Payer: Self-pay | Admitting: Physical Therapy

## 2019-04-12 ENCOUNTER — Other Ambulatory Visit: Payer: Self-pay

## 2019-04-12 ENCOUNTER — Ambulatory Visit: Payer: No Typology Code available for payment source | Admitting: Physical Therapy

## 2019-04-12 DIAGNOSIS — R29898 Other symptoms and signs involving the musculoskeletal system: Secondary | ICD-10-CM

## 2019-04-12 DIAGNOSIS — M25572 Pain in left ankle and joints of left foot: Secondary | ICD-10-CM

## 2019-04-12 DIAGNOSIS — M25672 Stiffness of left ankle, not elsewhere classified: Secondary | ICD-10-CM

## 2019-04-12 DIAGNOSIS — R262 Difficulty in walking, not elsewhere classified: Secondary | ICD-10-CM

## 2019-04-12 NOTE — Therapy (Signed)
[facility] [address]  [place], [place], [address] Phone: [phone]   Fax:  [phone]  Physical Therapy Treatment  Patient Details  Name: [name] MRN: [id number] Date of Birth: 1965-09-16 Referring Provider (PT): Crosby Oyster [place], [place]   Encounter Date: 04/12/2019  PT End of Session - 04/12/19 0926    Visit Number  3    Number of Visits  7    Date for PT Re-Evaluation  05/10/19    Authorization Type  [facility]    PT Start Time  0845    PT Stop Time  0925    PT Time Calculation (min)  40 min    Activity Tolerance  Patient tolerated treatment well    Behavior During Therapy  [facility] for tasks assessed/performed       Past Medical History:  Diagnosis Date  . Diabetes mellitus without complication (HCC)    hx- borderline- diet controlled   . GERD (gastroesophageal reflux disease)   . Headache   . Hyperlipidemia   . Obesity   . Tobacco use     Past Surgical History:  Procedure Laterality Date  . BACK SURGERY  02/2008  . WISDOM TOOTH EXTRACTION      There were no vitals filed for this visit.  Subjective Assessment - 04/12/19 0846    Subjective  Started wearing an ankle brace yesterday- having some soreness but feels good to be out of the boot.    Pertinent History  HLD, HA, GERD, DM, back surgery 2009    Patient Stated Goals  not be in pain    Currently in Pain?  No/denies                       [facility] Adult PT Treatment/Exercise - 04/12/19 0001      Exercises   Exercises  Ankle;Knee/Hip      Knee/Hip Exercises: Standing   Functional Squat  2 sets;10 reps    Functional Squat Limitations  at counter; 2nd set with heel raise   cues to avoid excessive use of UEs   SLS with Vectors  L LE with 1 UE support on counter x6 min    Other Standing Knee Exercises  sidestepping with red TB around toes 4x9ft   manual cues to correct lateral trunk lean     Ankle Exercises: Aerobic    Nustep  L3 x 6 min (LEs only)      Ankle Exercises: Stretches   Soleus Stretch  30 seconds;2 reps    Soleus Stretch Limitations  at counter top    Press photographer  30 seconds;2 reps    Gastroc Stretch Limitations  at counter top      Ankle Exercises: Standing   SLS  L SLS on floor/foam x5 min with intermittent UE support    Heel Raises  Left;5 reps    Heel Raises Limitations  3x5 reps; single leg at counter   heavy UE support    Other Standing Ankle Exercises  B eccentric heel raise at UBE 2x10      Ankle Exercises: Seated   Other Seated Ankle Exercises  4-way seated ankle green band x 10 reps each way              PT Education - 04/12/19 0926    Education Details  update to HEP; administered green TB for 4 way hip    Person(s) Educated  Patient  Methods  Explanation;Demonstration;Tactile cues;Verbal cues;Handout    Comprehension  Verbalized understanding;Returned demonstration       PT Short Term Goals - 04/05/19 0902      PT SHORT TERM GOAL #1   Title  Patient to be independent with initial HEP.    Time  3    Period  Weeks    Status  On-going    Target Date  04/19/19        PT Long Term Goals - 04/05/19 0902      PT LONG TERM GOAL #1   Title  Patient to be independent with advanced HEP.    Time  6    Period  Weeks    Status  On-going      PT LONG TERM GOAL #2   Title  Patient to demonstrate L ankle AROM WFL and pain-free.    Time  6    Period  Weeks    Status  On-going      PT LONG TERM GOAL #3   Title  Patient to demonstrate B ankle strength >=4+/5.    Time  6    Period  Weeks    Status  On-going      PT LONG TERM GOAL #4   Title  Patient to report tolerance of climbing ladders and stairs at work without pain limiting.    Time  6    Period  Weeks    Status  On-going            Plan - 04/12/19 [id number]    Clinical Impression Statement  Patient reporting that he started wearing an ankle brace at work yesterday- noting some soreness from  this but overall feeling better without the cam boot. Worked on progressive calf strengthening with patient demonstrating heavy UE use d/t gastroc weakness. Able to demonstrate good single leg balance on firm surface, thus challenged patient with compliant surface. Did demonstrate more ankle instability and UE support to stabilize himself on foam. Able to progress 4 way ankle with increased banded resistance with good tolerance. Thus, updated HEP with exercises that were well-tolerated today. Patient reported understanding and with no complaints at end of session.    Personal Factors and Comorbidities  Age;Comorbidity 3+;Time since onset of injury/illness/exacerbation;Fitness;Past/Current Experience;Profession    Comorbidities  HLD, HA, GERD, DM, back surgery 2009    Rehab Potential  Good    PT Treatment/Interventions  ADLs/Self Care Home Management;Cryotherapy;Electrical Stimulation;Iontophoresis 4mg /ml Dexamethasone;Moist Heat;Balance training;Therapeutic exercise;Therapeutic activities;Functional mobility training;Stair training;Gait training;Ultrasound;Neuromuscular re-education;Patient/family education;Orthotic Fit/Training;Manual techniques;Vasopneumatic Device;Taping;Energy conservation;Dry needling;Passive range of motion    PT Next Visit Plan  FOTO    Consulted and Agree with Plan of Care  Patient       Patient will benefit from skilled therapeutic intervention in order to improve the following deficits and impairments:  Increased edema, Decreased activity tolerance, Decreased strength, Increased fascial restricitons, Pain, Difficulty walking, Decreased balance, Decreased range of motion, Improper body mechanics, Postural dysfunction, Impaired flexibility  Visit Diagnosis: Pain in left ankle and joints of left foot  Stiffness of left ankle, not elsewhere classified  Difficulty in walking, not elsewhere classified  Other symptoms and signs involving the musculoskeletal  system     Problem List Patient Active Problem List   Diagnosis Date Noted  . Erectile dysfunction 08/18/2017     Janene Harvey, PT, DPT 04/12/19 9:31 AM   [facility] [place], [place], [address] Phone: [phone]  Fax:  [phone]  Name: [name] MRN: [id number] Date of Birth: 1965-09-17

## 2019-04-26 ENCOUNTER — Other Ambulatory Visit: Payer: Self-pay

## 2019-04-26 ENCOUNTER — Ambulatory Visit: Payer: No Typology Code available for payment source | Attending: Family Medicine | Admitting: Physical Therapy

## 2019-04-26 ENCOUNTER — Encounter: Payer: Self-pay | Admitting: Physical Therapy

## 2019-04-26 DIAGNOSIS — M25672 Stiffness of left ankle, not elsewhere classified: Secondary | ICD-10-CM | POA: Insufficient documentation

## 2019-04-26 DIAGNOSIS — R29898 Other symptoms and signs involving the musculoskeletal system: Secondary | ICD-10-CM | POA: Diagnosis present

## 2019-04-26 DIAGNOSIS — M25572 Pain in left ankle and joints of left foot: Secondary | ICD-10-CM | POA: Diagnosis not present

## 2019-04-26 DIAGNOSIS — R262 Difficulty in walking, not elsewhere classified: Secondary | ICD-10-CM | POA: Insufficient documentation

## 2019-04-26 NOTE — Therapy (Signed)
[facility] [address]  [place], [place], [address] Phone: [phone]   Fax:  [phone]  Physical Therapy Discharge Summary  Patient Details  Name: [name] MRN: [id number] Date of Birth: 1966-01-28 Referring Provider (PT): Crosby Oyster [place], [place]   Encounter Date: 04/26/2019  PT End of Session - 04/26/19 0917    Visit Number  4    Number of Visits  7    Date for PT Re-Evaluation  05/10/19    Authorization Type  [facility]    PT Start Time  0844    PT Stop Time  0915    PT Time Calculation (min)  31 min    Activity Tolerance  Patient tolerated treatment well    Behavior During Therapy  [facility] for tasks assessed/performed       Past Medical History:  Diagnosis Date  . Diabetes mellitus without complication (HCC)    hx- borderline- diet controlled   . GERD (gastroesophageal reflux disease)   . Headache   . Hyperlipidemia   . Obesity   . Tobacco use     Past Surgical History:  Procedure Laterality Date  . BACK SURGERY  02/2008  . WISDOM TOOTH EXTRACTION      There were no vitals filed for this visit.  Subjective Assessment - 04/26/19 0842    Subjective  Has been doing good. Does not feel like there is anything he struggles with. Feels like he is pretty close to being back to 100%.    Pertinent History  HLD, HA, GERD, DM, back surgery 2009    Patient Stated Goals  not be in pain    Currently in Pain?  No/denies         [facility] PT Assessment - 04/26/19 0001      AROM   Left Ankle Dorsiflexion  19    Left Ankle Plantar Flexion  45    Left Ankle Inversion  35    Left Ankle Eversion  26      Strength   Right Ankle Dorsiflexion  4+/5    Right Ankle Plantar Flexion  4+/5   10 reps   Right Ankle Inversion  4+/5    Right Ankle Eversion  4/5    Left Ankle Dorsiflexion  5/5    Left Ankle Plantar Flexion  4/5   5 reps   Left Ankle Inversion  4+/5    Left Ankle Eversion  4+/5                    [facility] Adult PT Treatment/Exercise - 04/26/19 0001      Knee/Hip Exercises: Aerobic   Recumbent Bike  L1 x 6 min      Ankle Exercises: Stretches   Other Stretch  L plantarflexion stretch 2x20" to tolerance    towel under toes for comfort     Ankle Exercises: Standing   Heel Raises  Both;10 reps    Heel Raises Limitations  eccentric heel raise at UBE x10; B concentric, L eccentric heel raise x10 at counter top             PT Education - 04/26/19 0917    Education Details  update and consolidation of HEP; discussion on objective progress and remaining impairments    Person(s) Educated  Patient    Methods  Explanation;Demonstration;Tactile cues;Verbal cues;Handout    Comprehension  Verbalized understanding;Returned demonstration       PT Short Term Goals -  04/26/19 0848      PT SHORT TERM GOAL #1   Title  Patient to be independent with initial HEP.    Time  3    Period  Weeks    Status  Achieved    Target Date  04/19/19        PT Long Term Goals - 04/26/19 0848      PT LONG TERM GOAL #1   Title  Patient to be independent with advanced HEP.    Time  6    Period  Weeks    Status  Achieved      PT LONG TERM GOAL #2   Title  Patient to demonstrate L ankle AROM WFL and pain-free.    Time  6    Period  Weeks    Status  Achieved   improved in dorsiflexion, plantarflexion, and eversion     PT LONG TERM GOAL #3   Title  Patient to demonstrate B ankle strength >=4+/5.    Time  6    Period  Weeks    Status  Partially Met   improved in B inversion, R plantarflexion, and L dorsiflexion strength; plantarflexion strength most limiting     PT LONG TERM GOAL #4   Title  Patient to report tolerance of climbing ladders and stairs at work without pain limiting.    Time  6    Period  Weeks    Status  Achieved            Plan - 04/26/19 0919    Clinical Impression Statement  Patient reporting nearly 100% improvement in L Achilles since  initial eval. Noting that he no longer has issues with functional activities and does not feel limited. Patient feels ready to transition to home program at this time. Patient has met stair and ladder climbing goal- reporting that he no longer has pain with these activities.  Strength testing revealed improvement in B inversion, R plantarflexion, and L dorsiflexion strength; plantarflexion strength most limiting at this time. L ankle AROM showed improvements in dorsiflexion, plantarflexion, and eversion. Reviewed and consolidated HEP with patient for max benefit. Patient reported understanding. Patient has met or partially met all goals and is ready for discharge and transition to home program at this time.    Personal Factors and Comorbidities  Age;Comorbidity 3+;Time since onset of injury/illness/exacerbation;Fitness;Past/Current Experience;Profession    Comorbidities  HLD, HA, GERD, DM, back surgery 2009    Rehab Potential  Good    PT Treatment/Interventions  ADLs/Self Care Home Management;Cryotherapy;Electrical Stimulation;Iontophoresis 5m/ml Dexamethasone;Moist Heat;Balance training;Therapeutic exercise;Therapeutic activities;Functional mobility training;Stair training;Gait training;Ultrasound;Neuromuscular re-education;Patient/family education;Orthotic Fit/Training;Manual techniques;Vasopneumatic Device;Taping;Energy conservation;Dry needling;Passive range of motion    PT Next Visit Plan  DC at this time    Consulted and Agree with Plan of Care  Patient       Patient will benefit from skilled therapeutic intervention in order to improve the following deficits and impairments:  Increased edema, Decreased activity tolerance, Decreased strength, Increased fascial restricitons, Pain, Difficulty walking, Decreased balance, Decreased range of motion, Improper body mechanics, Postural dysfunction, Impaired flexibility  Visit Diagnosis: Pain in left ankle and joints of left foot  Stiffness of left  ankle, not elsewhere classified  Difficulty in walking, not elsewhere classified  Other symptoms and signs involving the musculoskeletal system     Problem List Patient Active Problem List   Diagnosis Date Noted  . Erectile dysfunction 08/18/2017     PHYSICAL THERAPY DISCHARGE SUMMARY  Visits from Start of  Care: 4  Current functional level related to goals / functional outcomes: See above clinical impression   Remaining deficits: Decreased plantarflexion strength   Education / Equipment: HEP  Plan: Patient agrees to discharge.  Patient goals were partially met. Patient is being discharged due to meeting the stated rehab goals.  ?????     Janene Harvey, PT, DPT 04/26/19 9:23 AM   [facility] [place], [place], [address] Phone: [phone]   Fax:  [phone]  Name: ELL TISO MRN: [id number] Date of Birth: 1965/07/30

## 2019-05-10 ENCOUNTER — Encounter: Payer: No Typology Code available for payment source | Admitting: Physical Therapy

## 2019-12-14 IMAGING — CT CT CHEST LUNG CANCER SCREENING LOW DOSE W/O CM
1 of 2 series · 10 of 20 positions shown, 13 images · non-contrast
Comparison: None.

CLINICAL DATA: Current smoker. Thirty-five pack-year history.
Asymptomatic.

EXAM:
CT CHEST WITHOUT CONTRAST LOW-DOSE FOR LUNG CANCER SCREENING
TECHNIQUE: Multidetector CT imaging of the chest was performed following the
standard protocol without IV contrast.

[ct lung segmentation data · axial · 0.98mm/px · z∈[-377,-377]mm · 10 of 369 frames shown]
[frame 1/369  mediastinal]
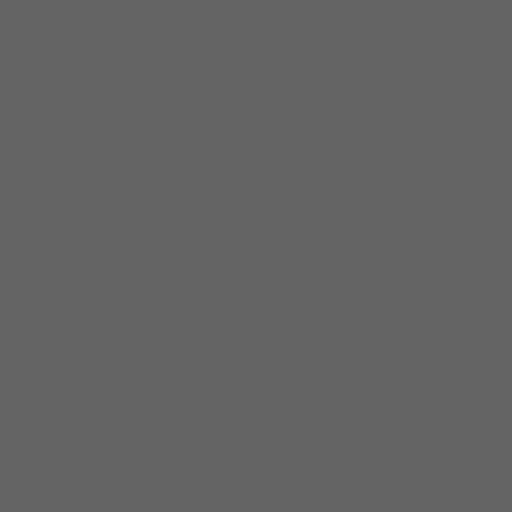
[frame 1/369  lung]
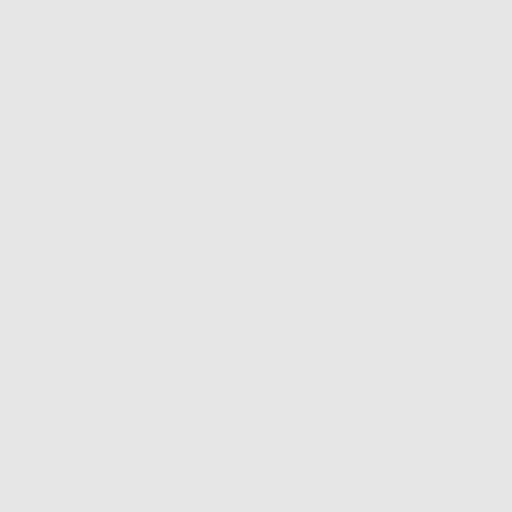
[frame 41/369  lung]
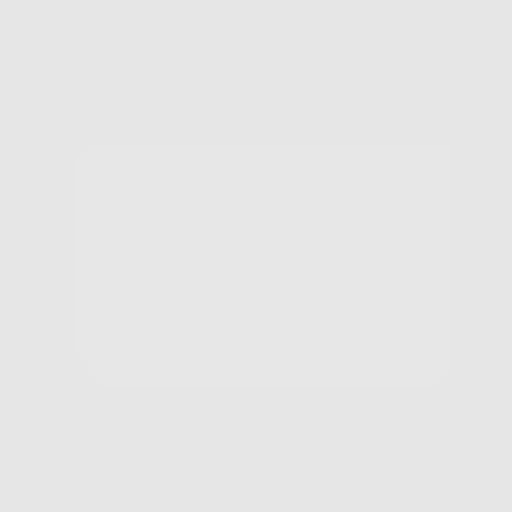
[frame 82/369  lung]
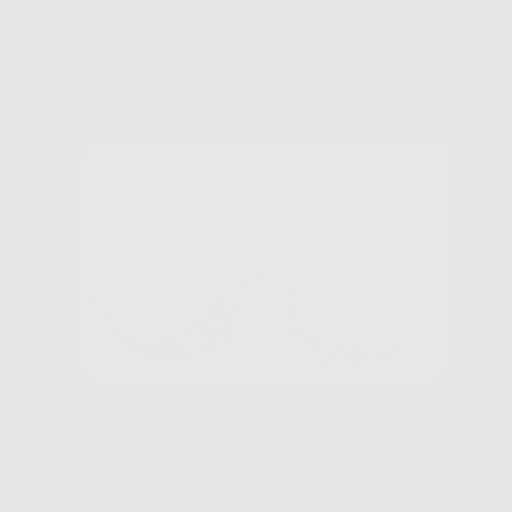
[frame 123/369  lung]
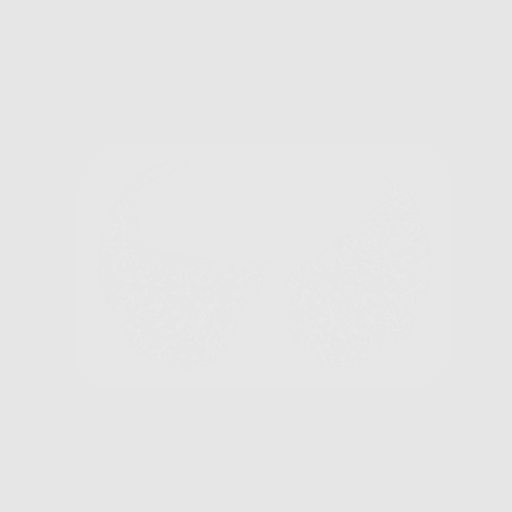
[frame 164/369  mediastinal]
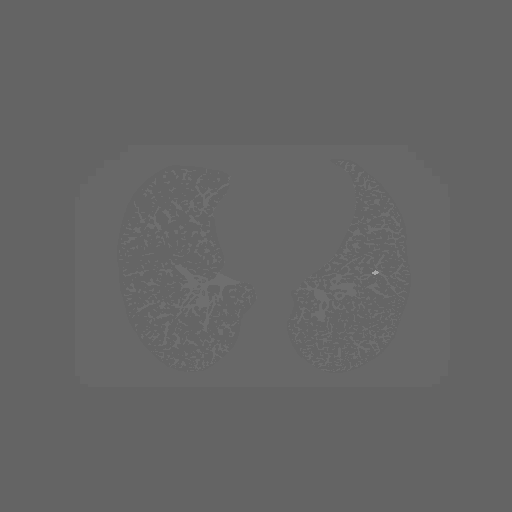
[frame 164/369  lung]
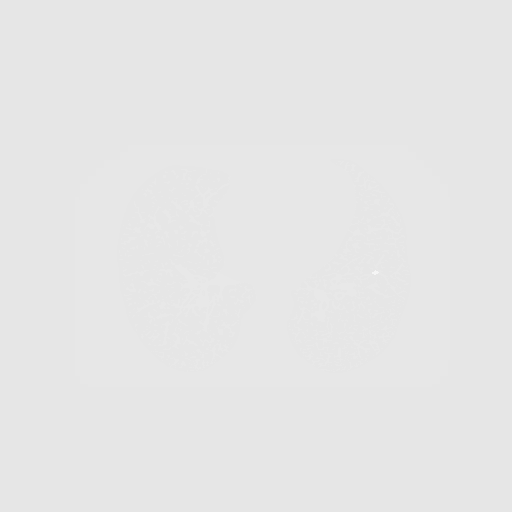
[frame 205/369  lung]
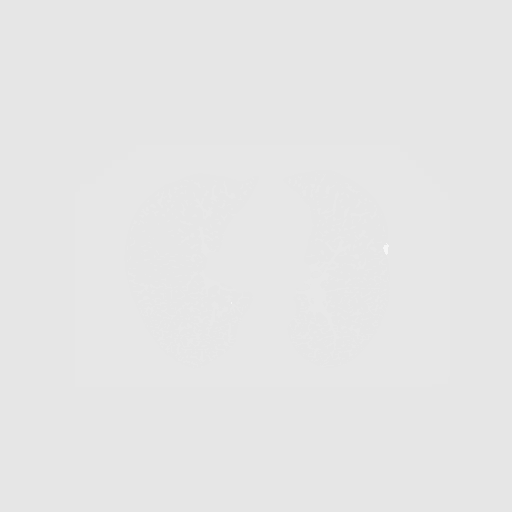
[frame 246/369  lung]
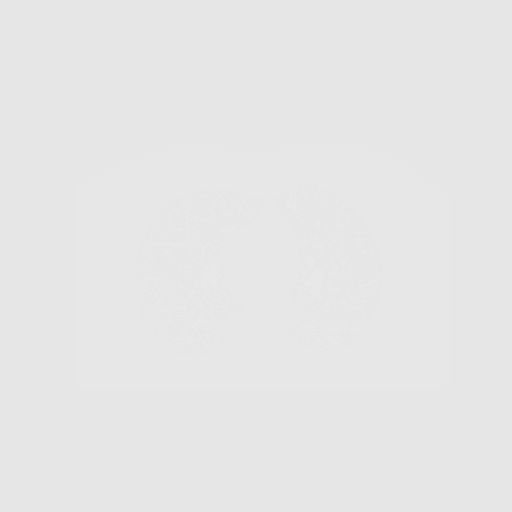
[frame 287/369  lung]
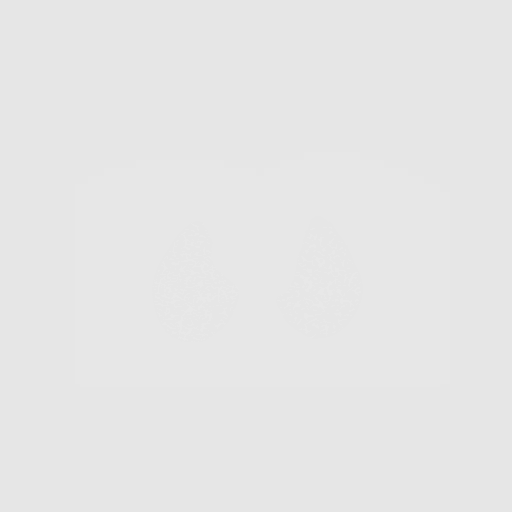
[frame 328/369  mediastinal]
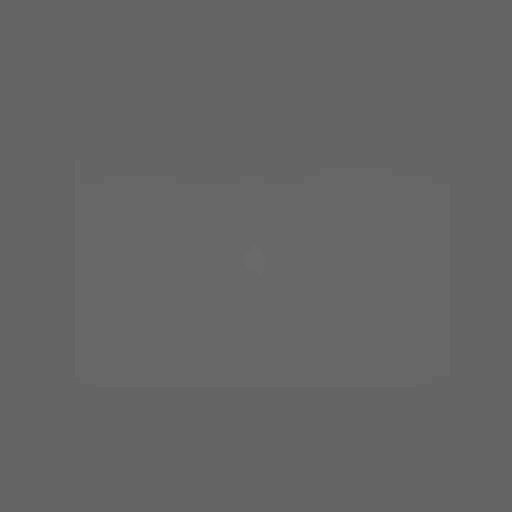
[frame 328/369  lung]
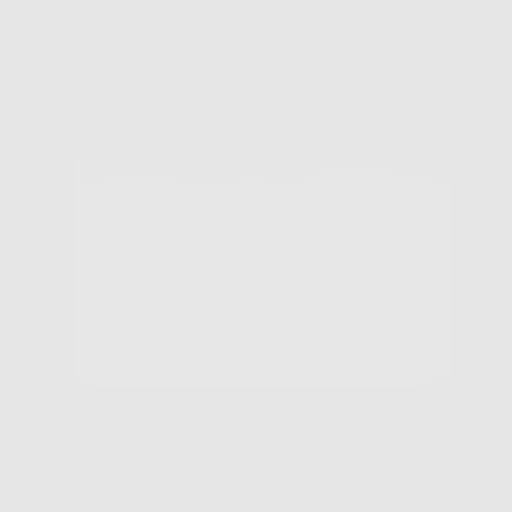
[frame 369/369  lung]
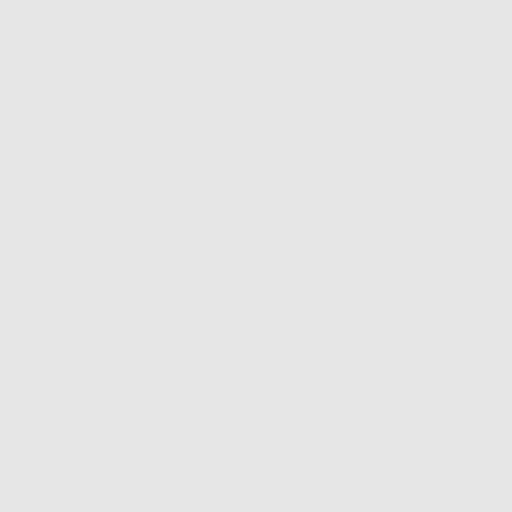

[10 of 20 positions shown; findings below may reference images not displayed]

FINDINGS: Cardiovascular: Normal heart size. No pericardial effusion
identified. Aortic atherosclerosis. Lad, RCA coronary artery
calcifications.

Mediastinum/Nodes: Normal appearance of the thyroid gland. The
trachea appears patent and is midline. Normal appearance of the
esophagus. No enlarged mediastinal or hilar lymph nodes.

Lungs/Pleura: Mild centrilobular emphysema. No pleural effusion or
pneumothorax. There is a single small pulmonary nodule within the
anterolateral left upper lobe which measures 3.7 mm.

Upper Abdomen: No acute abnormality identified. Gallstones are noted
measuring up to 5 mm.

Musculoskeletal: No acute or significant osseous abnormality.
Chronic right lateral rib deformities may be congenital or
posttraumatic.
IMPRESSION: 1. Lung-RADS 2, benign appearance or behavior. Continue annual
screening with low-dose chest CT without contrast in 12 months.
2. Gallstones

Aortic Atherosclerosis (NCWFS-JSV.V) and Emphysema (NCWFS-ZQ8.P).
Coronary artery calcifications noted.

## 2020-01-01 ENCOUNTER — Other Ambulatory Visit: Payer: Self-pay | Admitting: Family Medicine

## 2020-01-01 DIAGNOSIS — Z0100 Encounter for examination of eyes and vision without abnormal findings: Secondary | ICD-10-CM

## 2020-01-01 NOTE — Progress Notes (Signed)
ref

## 2020-01-03 ENCOUNTER — Other Ambulatory Visit: Payer: Self-pay

## 2020-01-03 ENCOUNTER — Encounter: Payer: Self-pay | Admitting: Family Medicine

## 2020-01-03 ENCOUNTER — Ambulatory Visit (INDEPENDENT_AMBULATORY_CARE_PROVIDER_SITE_OTHER): Payer: No Typology Code available for payment source | Admitting: Family Medicine

## 2020-01-03 VITALS — BP 120/82 | HR 73 | Temp 98.2°F | Ht 70.5 in | Wt 268.4 lb

## 2020-01-03 DIAGNOSIS — J439 Emphysema, unspecified: Secondary | ICD-10-CM | POA: Insufficient documentation

## 2020-01-03 DIAGNOSIS — Z0000 Encounter for general adult medical examination without abnormal findings: Secondary | ICD-10-CM

## 2020-01-03 DIAGNOSIS — I700 Atherosclerosis of aorta: Secondary | ICD-10-CM | POA: Insufficient documentation

## 2020-01-03 DIAGNOSIS — Z125 Encounter for screening for malignant neoplasm of prostate: Secondary | ICD-10-CM | POA: Diagnosis not present

## 2020-01-03 HISTORY — DX: Emphysema, unspecified: J43.9

## 2020-01-03 LAB — CBC
HCT: 48.4 % (ref 39.0–52.0)
Hemoglobin: 16.7 g/dL (ref 13.0–17.0)
MCHC: 34.4 g/dL (ref 30.0–36.0)
MCV: 89.6 fl (ref 78.0–100.0)
Platelets: 227 10*3/uL (ref 150.0–400.0)
RBC: 5.4 Mil/uL (ref 4.22–5.81)
RDW: 13 % (ref 11.5–15.5)
WBC: 7.1 10*3/uL (ref 4.0–10.5)

## 2020-01-03 LAB — COMPREHENSIVE METABOLIC PANEL
ALT: 37 U/L (ref 0–53)
AST: 21 U/L (ref 0–37)
Albumin: 4.4 g/dL (ref 3.5–5.2)
Alkaline Phosphatase: 58 U/L (ref 39–117)
BUN: 18 mg/dL (ref 6–23)
CO2: 28 mEq/L (ref 19–32)
Calcium: 9.5 mg/dL (ref 8.4–10.5)
Chloride: 106 mEq/L (ref 96–112)
Creatinine, Ser: 0.96 mg/dL (ref 0.40–1.50)
GFR: 80.85 mL/min (ref >60.00)
Glucose, Bld: 125 mg/dL — ABNORMAL HIGH (ref 70–99)
Potassium: 4.7 mEq/L (ref 3.5–5.1)
Sodium: 141 mEq/L (ref 135–145)
Total Bilirubin: 0.8 mg/dL (ref 0.2–1.2)
Total Protein: 7.2 g/dL (ref 6.0–8.3)

## 2020-01-03 LAB — LIPID PANEL
Cholesterol: 182 mg/dL (ref 0–200)
HDL: 36.6 mg/dL — ABNORMAL LOW (ref >39.00)
LDL Cholesterol: 124 mg/dL — ABNORMAL HIGH (ref 0–99)
NonHDL: 145.07
Total CHOL/HDL Ratio: 5
Triglycerides: 103 mg/dL (ref 0.0–149.0)
VLDL: 20.6 mg/dL (ref 0.0–40.0)

## 2020-01-03 LAB — PSA: PSA: 0.5 ng/mL (ref 0.10–4.00)

## 2020-01-03 MED ORDER — ROSUVASTATIN CALCIUM 20 MG PO TABS: 20.0000 mg | ORAL_TABLET | Freq: Every day | ORAL | 3 refills | Status: DC

## 2020-01-03 MED FILL — ROSUVASTATIN CALCIUM 20 MG: 20 | 90 days supply | Qty: 90 | Fill #0

## 2020-01-03 NOTE — Progress Notes (Signed)
Chief Complaint  Patient presents with  . Annual Exam    Well Male [name] is here for a complete physical.   His last physical was >1 year ago.  Current diet: in general, a "healthy" diet.  Current exercise: active at work Weight trend: gains in winter, loses in summer Fatigue out of ordinary? No. Seat belt? Yes.    Health maintenance Shingrix- Yes Colonoscopy- Yes Tetanus- Yes HIV- Yes Hep C- Yes  Past Medical History:  Diagnosis Date  . Diabetes mellitus without complication (HCC)    hx- borderline- diet controlled   . GERD (gastroesophageal reflux disease)   . Headache   . Hyperlipidemia   . Obesity   . Tobacco use       Past Surgical History:  Procedure Laterality Date  . BACK SURGERY  02/2008  . WISDOM TOOTH EXTRACTION     Medications  Takes no meds routinely.    Allergies No Known Allergies  Family History Family History  Problem Relation Age of Onset  . Diabetes Mother   . Cancer Father 73       prostate  . Prostate cancer Father   . Diabetes Sister   . Heart disease Sister 67       MI, pacemaker  . Other Brother        died in motorcycle accident  . Bladder Cancer Brother   . Colon cancer Neg Hx   . Colon polyps Neg Hx   . Rectal cancer Neg Hx   . Stomach cancer Neg Hx     Review of Systems: Constitutional:  no fevers Eye:  no recent significant change in vision Ear/Nose/Mouth/Throat:  Ears:  no hearing loss Nose/Mouth/Throat:  no complaints of nasal congestion, no sore throat Cardiovascular:  no chest pain Respiratory:  no shortness of breath Gastrointestinal:  no change in bowel habits GU:  Male: negative for dysuria, frequency Musculoskeletal/Extremities:  no new pain Integumentary (Skin/Breast):  no abnormal skin lesions reported Neurologic:  no headaches Endocrine: No unexpected weight changes Hematologic/Lymphatic:  no abnormal bleeding  Exam BP 120/82 (BP Location: Left Arm, Patient Position: Sitting, Cuff Size:  Large)   Pulse 73   Temp 98.2 F (36.8 C) (Oral)   Ht 5' 10.5" (1.791 m)   Wt 268 lb 6 oz (121.7 kg)   SpO2 97%   BMI 37.96 kg/m  General:  well developed, well nourished, in no apparent distress Skin:  no significant moles, warts, or growths Head:  no masses, lesions, or tenderness Eyes:  pupils equal and round, sclera anicteric without injection Ears:  canals without lesions, TMs shiny without retraction, no obvious effusion, no erythema Nose:  nares patent, septum midline, mucosa normal Throat/Pharynx:  lips and gingiva without lesion; tongue and uvula midline; non-inflamed pharynx; no exudates or postnasal drainage Neck: neck supple without adenopathy, thyromegaly, or masses Cardiac: RRR, no bruits, no LE edema Lungs:  clear to auscultation, breath sounds equal bilaterally, no respiratory distress Rectal: Deferred Musculoskeletal:  symmetrical muscle groups noted without atrophy or deformity Neuro:  gait normal; deep tendon reflexes normal and symmetric Psych: well oriented with normal range of affect and appropriate judgment/insight  Assessment and Plan  Well adult exam - Plan: CBC, Comprehensive metabolic panel, Lipid panel  Screening for prostate cancer - Plan: PSA  Pulmonary emphysema, unspecified emphysema type (HCC)  Aortic atherosclerosis (HCC) - Plan: rosuvastatin (CRESTOR) 20 MG tablet, Lipid panel, Hepatic function panel   Well 54 y.o. male. Counseled on diet and exercise.  Counseled on risks and benefits of prostate cancer screening with PSA. The patient agrees to undergo testing. Start statin. Reck labs in 6 weeks.  Stop smoking. He will reach out if he would like some help (Chantix, etc).  Immunizations, labs, and further orders as above. Follow up in 6 mo for med ck. The patient voiced understanding and agreement to the plan.  [place], DO 01/03/20 9:43 AM

## 2020-01-03 NOTE — Patient Instructions (Addendum)
Give [place] 2-3 business days to get the results of your labs back.   Keep the diet clean and stay active.  I recommend getting the flu shot in mid October. This suggestion would change if the CDC comes out with a different recommendation.   Let me know if your ankle/heel gets worse and we can get you set up with a specialist or the physical therapy team.   Let me know if you want to start something for smoking cessation.  Let me know if there are cost issues with the Crestor. It should be very affordable.   Let us know if you need anything.

## 2020-01-06 ENCOUNTER — Other Ambulatory Visit (INDEPENDENT_AMBULATORY_CARE_PROVIDER_SITE_OTHER): Payer: No Typology Code available for payment source

## 2020-01-06 DIAGNOSIS — R739 Hyperglycemia, unspecified: Secondary | ICD-10-CM | POA: Diagnosis not present

## 2020-01-06 LAB — HEMOGLOBIN A1C: Hgb A1c MFr Bld: 6.1 % (ref 4.6–6.5)

## 2020-01-28 ENCOUNTER — Other Ambulatory Visit: Payer: Self-pay | Admitting: Family Medicine

## 2020-01-28 DIAGNOSIS — F17210 Nicotine dependence, cigarettes, uncomplicated: Secondary | ICD-10-CM

## 2020-02-12 NOTE — Addendum Note (Signed)
Addended by: Kelle Darting A on: 02/12/2020 03:14 PM   Modules accepted: Orders

## 2020-02-14 ENCOUNTER — Other Ambulatory Visit (INDEPENDENT_AMBULATORY_CARE_PROVIDER_SITE_OTHER): Payer: No Typology Code available for payment source

## 2020-02-14 ENCOUNTER — Other Ambulatory Visit: Payer: Self-pay

## 2020-02-14 DIAGNOSIS — I700 Atherosclerosis of aorta: Secondary | ICD-10-CM

## 2020-02-14 LAB — LIPID PANEL
Cholesterol: 104 mg/dL (ref <200)
HDL: 33 mg/dL — ABNORMAL LOW (ref >=40)
LDL Cholesterol (Calc): 55 mg/dL (calc)
Non-HDL Cholesterol (Calc): 71 mg/dL (calc) (ref <130)
Total CHOL/HDL Ratio: 3.2 (calc) (ref <5.0)
Triglycerides: 75 mg/dL (ref <150)

## 2020-02-14 LAB — HEPATIC FUNCTION PANEL
AG Ratio: 1.6 (calc) (ref 1.0–2.5)
ALT: 26 U/L (ref 9–46)
AST: 17 U/L (ref 10–35)
Albumin: 4.2 g/dL (ref 3.6–5.1)
Alkaline phosphatase (APISO): 53 U/L (ref 35–144)
Bilirubin, Direct: 0.2 mg/dL (ref 0.0–0.2)
Globulin: 2.7 g/dL (calc) (ref 1.9–3.7)
Indirect Bilirubin: 0.6 mg/dL (calc) (ref 0.2–1.2)
Total Bilirubin: 0.8 mg/dL (ref 0.2–1.2)
Total Protein: 6.9 g/dL (ref 6.1–8.1)

## 2020-07-10 ENCOUNTER — Ambulatory Visit (INDEPENDENT_AMBULATORY_CARE_PROVIDER_SITE_OTHER): Payer: No Typology Code available for payment source | Admitting: Family Medicine

## 2020-07-10 ENCOUNTER — Encounter: Payer: Self-pay | Admitting: Family Medicine

## 2020-07-10 ENCOUNTER — Other Ambulatory Visit: Payer: Self-pay | Admitting: Family Medicine

## 2020-07-10 ENCOUNTER — Other Ambulatory Visit: Payer: Self-pay

## 2020-07-10 VITALS — BP 120/84 | HR 87 | Temp 98.3°F | Ht 71.0 in | Wt 262.2 lb

## 2020-07-10 DIAGNOSIS — Z23 Encounter for immunization: Secondary | ICD-10-CM

## 2020-07-10 DIAGNOSIS — I700 Atherosclerosis of aorta: Secondary | ICD-10-CM

## 2020-07-10 DIAGNOSIS — K219 Gastro-esophageal reflux disease without esophagitis: Secondary | ICD-10-CM | POA: Diagnosis not present

## 2020-07-10 LAB — COMPREHENSIVE METABOLIC PANEL
ALT: 37 U/L (ref 0–53)
AST: 19 U/L (ref 0–37)
Albumin: 4.5 g/dL (ref 3.5–5.2)
Alkaline Phosphatase: 60 U/L (ref 39–117)
BUN: 13 mg/dL (ref 6–23)
CO2: 29 mEq/L (ref 19–32)
Calcium: 9.8 mg/dL (ref 8.4–10.5)
Chloride: 105 mEq/L (ref 96–112)
Creatinine, Ser: 0.86 mg/dL (ref 0.40–1.50)
GFR: 96.38 mL/min (ref >60.00)
Glucose, Bld: 127 mg/dL — ABNORMAL HIGH (ref 70–99)
Potassium: 4.7 mEq/L (ref 3.5–5.1)
Sodium: 139 mEq/L (ref 135–145)
Total Bilirubin: 0.9 mg/dL (ref 0.2–1.2)
Total Protein: 7.3 g/dL (ref 6.0–8.3)

## 2020-07-10 LAB — LIPID PANEL
Cholesterol: 175 mg/dL (ref 0–200)
HDL: 40.1 mg/dL (ref >39.00)
LDL Cholesterol: 112 mg/dL — ABNORMAL HIGH (ref 0–99)
NonHDL: 134.71
Total CHOL/HDL Ratio: 4
Triglycerides: 114 mg/dL (ref 0.0–149.0)
VLDL: 22.8 mg/dL (ref 0.0–40.0)

## 2020-07-10 MED ORDER — ROSUVASTATIN CALCIUM 20 MG PO TABS: 20.0000 mg | ORAL_TABLET | Freq: Every day | ORAL | 2 refills | Status: DC

## 2020-07-10 MED ORDER — PANTOPRAZOLE SODIUM 40 MG PO TBEC: 40.0000 mg | DELAYED_RELEASE_TABLET | Freq: Every day | ORAL | 2 refills | Status: DC

## 2020-07-10 MED FILL — PANTOPRAZOLE SOD DR 40 MG T: 40 | 90 days supply | Qty: 90 | Fill #0

## 2020-07-10 MED FILL — ROSUVASTATIN CALCIUM 20 MG: 20 | 90 days supply | Qty: 90 | Fill #0

## 2020-07-10 NOTE — Progress Notes (Signed)
Chief complaint: Follow-up  Subjective: Hyperlipidemia Patient presents for Hyperlipidemia follow up. Currently taking Crestor 20 mg/d and compliance with treatment thus far has been good. He denies myalgias. He is adhering to a healthy diet. Exercise: active at work The patient is not known to have coexisting coronary artery disease.  Reflux The patient has had acid reflux for several decades.  He takes an over-the-counter PPI with some relief.  He was told his insurance company will cover Protonix with no cost to him.  Spicy and savory foods seem to set him off the worse.  Laying down/nighttime is also bothersome.  He is not throwing up blood or having any dark/tarry stools.  No unintentional weight loss.  Past Medical History:  Diagnosis Date  . Diabetes mellitus without complication (HCC)    hx- borderline- diet controlled   . GERD (gastroesophageal reflux disease)   . Headache   . Hyperlipidemia   . Obesity   . Tobacco use     Objective: BP 120/84 (BP Location: Left Arm, Patient Position: Sitting, Cuff Size: Normal)   Pulse 87   Temp 98.3 F (36.8 C) (Oral)   Ht 5\' 11"  (1.803 m)   Wt 262 lb 4 oz (119 kg)   SpO2 96%   BMI 36.58 kg/m  General: Awake, appears stated age HEENT: MMM Heart: RRR, no LE edema, no bruits Lungs: CTAB, no rales, wheezes or rhonchi. No accessory muscle use Psych: Age appropriate judgment and insight, normal affect and mood  Assessment and Plan: Aortic atherosclerosis (HCC) - Plan: rosuvastatin (CRESTOR) 20 MG tablet, Comprehensive metabolic panel, Lipid panel  Gastroesophageal reflux disease, unspecified whether esophagitis present - Plan: pantoprazole (PROTONIX) 40 MG tablet  1. Cont Crestor 20 mg/d. Ck labs. Counseled on diet/exercise. 2. Start Protonix. Pepcid prn. Reflux precautions verbalized. Non-med options written down.  F/u in 6 mo for CPE or prn. The patient voiced understanding and agreement to the plan.  Bartholomew,  DO 07/10/20  8:33 AM

## 2020-07-10 NOTE — Patient Instructions (Addendum)
Consider taking Pepcid (famotidine) 20 mg twice daily as needed.   The only lifestyle changes that have data behind them are weight loss for the overweight/obese and elevating the head of the bed. Finding out which foods/positions are triggers is important.  Keep the diet clean and stay active.  Give [place] 2-3 business days to get the results of your labs back.   Try to do those home stretches/exercises for your back. Physical therapy would be the next step.  Heat (pad or rice pillow in microwave) over affected area, 10-15 minutes twice daily.   Ice/cold pack over area for 10-15 min twice daily.  Let us know if you need anything.

## 2020-09-15 ENCOUNTER — Encounter: Payer: Self-pay | Admitting: Gastroenterology

## 2020-12-28 ENCOUNTER — Other Ambulatory Visit (HOSPITAL_BASED_OUTPATIENT_CLINIC_OR_DEPARTMENT_OTHER): Payer: Self-pay

## 2020-12-28 ENCOUNTER — Encounter: Payer: Self-pay | Admitting: Family Medicine

## 2020-12-28 ENCOUNTER — Telehealth (INDEPENDENT_AMBULATORY_CARE_PROVIDER_SITE_OTHER): Payer: No Typology Code available for payment source | Admitting: Family Medicine

## 2020-12-28 ENCOUNTER — Other Ambulatory Visit: Payer: Self-pay | Admitting: Family Medicine

## 2020-12-28 ENCOUNTER — Telehealth: Payer: Self-pay | Admitting: Family Medicine

## 2020-12-28 DIAGNOSIS — H9201 Otalgia, right ear: Secondary | ICD-10-CM | POA: Diagnosis not present

## 2020-12-28 MED ORDER — PREDNISONE 20 MG PO TABS
40.0000 mg | ORAL_TABLET | Freq: Every day | ORAL | 0 refills | Status: AC
Start: 2020-12-28 — End: 2021-01-02

## 2020-12-28 MED ORDER — AMOXICILLIN 500 MG PO CAPS
500.0000 mg | ORAL_CAPSULE | Freq: Two times a day (BID) | ORAL | 0 refills | Status: DC
  Filled 2020-12-28: qty 14, 7d supply, fill #0

## 2020-12-28 MED ORDER — PREDNISONE 20 MG PO TABS
40.0000 mg | ORAL_TABLET | Freq: Every day | ORAL | 0 refills | Status: DC
  Filled 2020-12-28: qty 10, 5d supply, fill #0

## 2020-12-28 MED ORDER — AMOXICILLIN 500 MG PO CAPS: 500.0000 mg | ORAL_CAPSULE | Freq: Two times a day (BID) | ORAL | 0 refills | Status: AC

## 2020-12-28 NOTE — Telephone Encounter (Signed)
Sent in and patient notified 

## 2020-12-28 NOTE — Progress Notes (Signed)
CC: Ear pain  Pt is here for right ear pain. Due to COVID-19 pandemic, we are interacting via web portal for an electronic face-to-face visit. I verified patient's ID using 2 identifiers. Patient agreed to proceed with visit via this method. Patient is at home, I am at office. Patient and I are present for visit.   Duration: 2 days Progression: unchanged Associated symptoms: just pain Denies: sore throat, congestion, sneezing, ear pressure, and fevers, bleeding, or discharge from ear Treatment to date: Tylenol  Past Medical History:  Diagnosis Date   Diabetes mellitus without complication (HCC)    hx- borderline- diet controlled    GERD (gastroesophageal reflux disease)    Headache    Hyperlipidemia    Obesity    Tobacco use    Objective No conversational dyspnea Age appropriate judgment and insight Nml affect and mood  Right ear pain - Plan: predniSONE (DELTASONE) 20 MG tablet, amoxicillin (AMOXIL) 500 MG capsule  5 d pred burst for likely ETD vs effusion. If no better in 2 d, will start amoxicillin to tx for AOM. Doubt OE given inner discomfort and lack of drainage.  F/u prn.  Pt voiced understanding and agreement to the plan.  [place], DO 12/28/20 2:13 PM

## 2020-12-28 NOTE — Telephone Encounter (Signed)
Patient called saying his medicine was sent to the wrong pharmacy, he would like his Rx #: [id number]  predniSONE (DELTASONE) 20 MG tablet be sent to: [id number] [place] ([phone] Thank you

## 2021-01-22 ENCOUNTER — Encounter: Payer: No Typology Code available for payment source | Admitting: Family Medicine

## 2021-02-07 ENCOUNTER — Encounter: Payer: Self-pay | Admitting: Gastroenterology

## 2021-03-16 ENCOUNTER — Other Ambulatory Visit (HOSPITAL_BASED_OUTPATIENT_CLINIC_OR_DEPARTMENT_OTHER): Payer: Self-pay

## 2021-03-16 MED FILL — Pantoprazole Sodium EC Tab 40 MG (Base Equiv): ORAL | 90 days supply | Qty: 90 | Fill #0 | Status: AC

## 2021-03-16 MED FILL — Rosuvastatin Calcium Tab 20 MG: ORAL | 90 days supply | Qty: 90 | Fill #0 | Status: AC

## 2021-05-25 ENCOUNTER — Telehealth: Payer: Self-pay

## 2021-05-25 NOTE — Telephone Encounter (Signed)
Caller states, pt has abscess on back near shoulder. Pt has arm pain from it. Declined rn  Telephone: [phone]  Transaction Date/Time: 05/24/2021 7:26:11 AM (ET)

## 2021-05-25 NOTE — Telephone Encounter (Signed)
Called left message to call back. Both number given/and cell

## 2021-05-25 NOTE — Telephone Encounter (Signed)
Called left message to call back to schedule with PCP.

## 2021-05-25 NOTE — Telephone Encounter (Signed)
Appt scheduled tomorrow

## 2021-05-26 ENCOUNTER — Ambulatory Visit (INDEPENDENT_AMBULATORY_CARE_PROVIDER_SITE_OTHER): Payer: No Typology Code available for payment source | Admitting: Family Medicine

## 2021-05-26 ENCOUNTER — Encounter: Payer: Self-pay | Admitting: Family Medicine

## 2021-05-26 ENCOUNTER — Telehealth: Payer: Self-pay | Admitting: Family Medicine

## 2021-05-26 ENCOUNTER — Other Ambulatory Visit (HOSPITAL_BASED_OUTPATIENT_CLINIC_OR_DEPARTMENT_OTHER): Payer: Self-pay

## 2021-05-26 VITALS — BP 110/68 | HR 92 | Temp 98.2°F | Ht 71.0 in | Wt 272.5 lb

## 2021-05-26 DIAGNOSIS — Z634 Disappearance and death of family member: Secondary | ICD-10-CM | POA: Diagnosis not present

## 2021-05-26 DIAGNOSIS — L0291 Cutaneous abscess, unspecified: Secondary | ICD-10-CM

## 2021-05-26 MED ORDER — HYDROCODONE-ACETAMINOPHEN 5-325 MG PO TABS
1.0000 | ORAL_TABLET | Freq: Four times a day (QID) | ORAL | 0 refills | Status: DC | PRN
Start: 2021-05-26 — End: 2021-10-15
  Filled 2021-05-26: qty 12, 3d supply, fill #0

## 2021-05-26 MED ORDER — DOXYCYCLINE HYCLATE 100 MG PO TABS
100.0000 mg | ORAL_TABLET | Freq: Two times a day (BID) | ORAL | 0 refills | Status: AC
Start: 2021-05-26 — End: 2021-06-02
  Filled 2021-05-26: qty 14, 7d supply, fill #0

## 2021-05-26 MED ORDER — SERTRALINE HCL 50 MG PO TABS
50.0000 mg | ORAL_TABLET | Freq: Every day | ORAL | 3 refills | Status: DC
Start: 2021-05-26 — End: 2021-07-01
  Filled 2021-05-26: qty 30, 30d supply, fill #0
  Filled 2021-07-01: qty 30, 30d supply, fill #1

## 2021-05-26 NOTE — Telephone Encounter (Signed)
Patient has appt today and will discuss with PCP

## 2021-05-26 NOTE — Patient Instructions (Addendum)
Do not shower for the rest of the day. When you do wash it, use only soap and water. Do not vigorously scrub.   Things to look out for: increasing pain not relieved by ibuprofen/acetaminophen, fevers, spreading redness, drainage of pus, or foul odor.  OK to pull packing in 1 week on your own or have me do it.   Aim to do some physical exertion for 150 minutes per week. This is typically divided into 5 days per week, 30 minutes per day. The activity should be enough to get your heart rate up. Anything is better than nothing if you have time constraints.  Please consider counseling. Contact [phone] to schedule an appointment or inquire about cost/insurance coverage.  [facility] located at [place], [place], [place].  Phone number = [phone].  Dr. Lennice Sites - Adult Psychiatry.    [facility] located at [place], [place], [place]. Phone number = [phone].   [facility] located at [address] [place], [place].  Phone number = [phone].   [facility] located at [place], [place], [place].  Phone number = [phone].  Coping skills Choose 5 that work for you: Take a deep breath Count to 20 Read a book Do a puzzle Meditate Bake Sing Knit Garden Pray Go outside Call a friend Listen to music Take a walk Color Send a note Take a bath Watch a movie Be alone in a quiet place Pet an animal Visit a friend Journal Exercise Stretch

## 2021-05-26 NOTE — Telephone Encounter (Signed)
Pt stated he was returning phone call. Advised him cma was with a pt and if she does not have the opportunity to call back he has an appt today to discuss matters. Please advise.

## 2021-05-26 NOTE — Progress Notes (Signed)
Chief Complaint  Patient presents with   left shoulder abcess    Depression/grandbaby (58 years old) passed away in September    [name] is a 56 y.o. male here for a skin complaint.  Duration: 4 days Location: L shoulder area Pruritic? No Painful? Yes Drainage? No Other associated symptoms: no fevers or spreading redness Therapies tried thus far: hot water  September 2022-the patient's granddaughter passed away 107 years old.  This was a terminal illness but still has been very tough on him and his family.  He is having lots of tearfulness, depression, and loss of interest in doing things.  He is also having anxiety related to the issue.  He is not following with a counselor or psychologist this time.  No homicidal or suicidal ideation.  He has no history of depression or anxiety.  He has never been on a medication but is interested in going on one to help with his symptoms.  Past Medical History:  Diagnosis Date   Diabetes mellitus without complication (HCC)    hx- borderline- diet controlled    GERD (gastroesophageal reflux disease)    Headache    Hyperlipidemia    Obesity    Tobacco use     BP 110/68    Pulse 92    Temp 98.2 F (36.8 C) (Oral)    Ht 5\' 11"  (1.803 m)    Wt 272 lb 8 oz (123.6 kg)    SpO2 95%    BMI 38.01 kg/m  Gen: awake, alert, appearing stated age Lungs: No accessory muscle use Skin: 4 cm x 3.5 centimeters circular lesion with some fluctuance in the left upper shoulder region that is tender to palpation and has warmth compared to surrounding tissue.  There is overlying erythema.  There is no excoriation or spreading redness. Psych: Age appropriate judgment and insight, he did become tearful during the exam.  Procedure note; incision and drainage Informed consent obtained. The area was cleaned with alcohol. The area was anesthetized with 4.5 mL of 1% lidocaine with epinephrine. Once adequate anesthesia was obtained, a cruciate incision was made with 11  blade scalpel. Approximately 10 mL of purulent material with blood was expressed. Cultures were taken. Loculations were interrupted with a curved hemostat. The area was packed with approximately 10 cm of 0.25 in reg gauze. The area was then dressed with gauze. There were no complications noted. The patient tolerated the procedure well.  Bereavement - Plan: sertraline (ZOLOFT) 50 MG tablet  Abscess - Plan: doxycycline (VIBRA-TABS) 100 MG tablet, HYDROcodone-acetaminophen (NORCO) 5-325 MG tablet, PR DRAIN SKIN ABSCESS SIMPLE  Chronic, uncontrolled.  Start Zoloft 25 mg daily for 2 weeks and then increase to 50 mg daily.  Counseling information provided in his AVS.  Counseled on exercise.  Follow-up in 1 month to recheck this. I&D today, doxycycline 100 mg twice daily for 7 days. Warning signs and symptoms verbalized and written down in AVS. Norco for breakthrough pain.  We will pull packing in 1 week.  His wife is a Marine scientist so she may do this. The patient voiced understanding and agreement to the plan.  [place], DO 05/26/21 4:12 PM

## 2021-06-04 ENCOUNTER — Encounter: Payer: Self-pay | Admitting: Family Medicine

## 2021-06-04 ENCOUNTER — Ambulatory Visit (INDEPENDENT_AMBULATORY_CARE_PROVIDER_SITE_OTHER): Payer: No Typology Code available for payment source | Admitting: Family Medicine

## 2021-06-04 VITALS — BP 140/78 | HR 74 | Temp 98.2°F | Resp 18 | Ht 71.0 in | Wt 273.0 lb

## 2021-06-04 DIAGNOSIS — Z5189 Encounter for other specified aftercare: Secondary | ICD-10-CM

## 2021-06-04 NOTE — Patient Instructions (Signed)
Apply a thin layer of triple antibiotic ointment over the area and pull the edges together. Then use a butterfly bandage. Continue this until the edges come together.   Let us know if you need anything.

## 2021-06-04 NOTE — Progress Notes (Signed)
CC: Recheck abscess S: Patient was seen 1 week ago for an incision and drainage on an abscess with surrounding cellulitis in his left upper shoulder.  He reports pain 2 days after the procedure but has been doing well since then.  He finished a 7-day course of doxycycline yesterday.  He has been keeping clean and dry with daily dressing changes.  There is some drainage that has an odor to it but no fevers, spreading redness, or increasing pain. O: Patient alert and oriented, age-appropriate judgment and insight.  There is a 2 cm open wound on his left upper shoulder without surrounding erythema.  There is no tenderness to palpation.  It is clean and dry. A/P:  Abscess re-check I removed the packing today.  We redressed it well in the office.  Warning signs and symptoms verbalized and written down.  It should continue to drain but there are no current signs of infection.  Follow-up as needed. Crosby Oyster Marcelene Weidemann 1:19 PM 06/04/21

## 2021-07-01 ENCOUNTER — Other Ambulatory Visit: Payer: Self-pay | Admitting: Family Medicine

## 2021-07-01 ENCOUNTER — Other Ambulatory Visit (HOSPITAL_BASED_OUTPATIENT_CLINIC_OR_DEPARTMENT_OTHER): Payer: Self-pay

## 2021-07-01 DIAGNOSIS — Z634 Disappearance and death of family member: Secondary | ICD-10-CM

## 2021-07-01 MED FILL — Pantoprazole Sodium EC Tab 40 MG (Base Equiv): ORAL | 90 days supply | Qty: 90 | Fill #1 | Status: AC

## 2021-07-01 MED FILL — Rosuvastatin Calcium Tab 20 MG: ORAL | 90 days supply | Qty: 90 | Fill #1 | Status: AC

## 2021-07-02 ENCOUNTER — Encounter: Payer: Self-pay | Admitting: Family Medicine

## 2021-07-02 ENCOUNTER — Ambulatory Visit (INDEPENDENT_AMBULATORY_CARE_PROVIDER_SITE_OTHER): Payer: No Typology Code available for payment source | Admitting: Family Medicine

## 2021-07-02 ENCOUNTER — Other Ambulatory Visit (HOSPITAL_BASED_OUTPATIENT_CLINIC_OR_DEPARTMENT_OTHER): Payer: Self-pay

## 2021-07-02 VITALS — BP 122/80 | HR 74 | Temp 98.8°F | Ht 71.0 in | Wt 269.1 lb

## 2021-07-02 DIAGNOSIS — F4321 Adjustment disorder with depressed mood: Secondary | ICD-10-CM

## 2021-07-02 DIAGNOSIS — Z634 Disappearance and death of family member: Secondary | ICD-10-CM | POA: Diagnosis not present

## 2021-07-02 MED ORDER — SERTRALINE HCL 50 MG PO TABS
50.0000 mg | ORAL_TABLET | Freq: Every day | ORAL | 1 refills | Status: DC
  Filled 2021-07-02 (×2): qty 90, 90d supply, fill #0

## 2021-07-02 MED ORDER — SERTRALINE HCL 50 MG PO TABS
50.0000 mg | ORAL_TABLET | Freq: Every day | ORAL | 0 refills | Status: DC
  Filled 2021-07-02: qty 90, 90d supply, fill #0

## 2021-07-02 NOTE — Progress Notes (Addendum)
Chief Complaint  Patient presents with   Follow-up    1 month    Subjective [name] presents for f/u anxiety/depression.  Pt is currently being treated with Zoloft 50 mg/d.  Reports doing a little better since treatment. No thoughts of harming self or others. No self-medication with alcohol, prescription drugs or illicit drugs. He is not exercising right now.  Pt is not following with a counselor/psychologist.  Past Medical History:  Diagnosis Date   Diabetes mellitus without complication ([place])    hx- borderline- diet controlled    GERD (gastroesophageal reflux disease)    Headache    Hyperlipidemia    Obesity    Tobacco use    Allergies as of 07/02/2021   No Known Allergies      Medication List        Accurate as of July 02, 2021 11:59 PM. If you have any questions, ask your nurse or doctor.          HYDROcodone-acetaminophen 5-325 MG tablet Commonly known as: Norco Take 1 tablet by mouth every 6 (six) hours as needed for moderate pain.   pantoprazole 40 MG tablet Commonly known as: PROTONIX TAKE 1 TABLET (40 MG TOTAL) BY MOUTH DAILY.   rosuvastatin 20 MG tablet Commonly known as: CRESTOR TAKE 1 TABLET (20 MG TOTAL) BY MOUTH DAILY.   sertraline 50 MG tablet Commonly known as: ZOLOFT Take 1 tablet (50 mg total) by mouth daily.        Exam BP 122/80    Pulse 74    Temp 98.8 F (37.1 C) (Oral)    Ht 5\' 11"  (1.803 m)    Wt 269 lb 2 oz (122.1 kg)    SpO2 95%    BMI 37.54 kg/m  General:  well developed, well nourished, in no apparent distress Lungs:  No respiratory distress Psych: well oriented with normal range of affect and age-appropriate judgement/insight, alert and oriented x4.  Assessment and Plan  Situational depression  Bereavement  Currently stable. Cont Zoloft 50 mg/d. Counseled on exercise. Suggested counseling.  F/u in 4 mo for CPE or prn. The patient voiced understanding and agreement to the plan.  [place], DO 07/07/21 9:05 AM

## 2021-07-02 NOTE — Patient Instructions (Addendum)
Stay active.  Send me a message if you would like to come off of the medication.   Let us know if you need anything.

## 2021-09-24 ENCOUNTER — Encounter: Payer: No Typology Code available for payment source | Admitting: Family Medicine

## 2021-10-15 ENCOUNTER — Ambulatory Visit (INDEPENDENT_AMBULATORY_CARE_PROVIDER_SITE_OTHER): Payer: No Typology Code available for payment source | Admitting: Family Medicine

## 2021-10-15 ENCOUNTER — Other Ambulatory Visit: Payer: Self-pay | Admitting: Family Medicine

## 2021-10-15 ENCOUNTER — Encounter: Payer: Self-pay | Admitting: Family Medicine

## 2021-10-15 ENCOUNTER — Other Ambulatory Visit (INDEPENDENT_AMBULATORY_CARE_PROVIDER_SITE_OTHER): Payer: No Typology Code available for payment source

## 2021-10-15 VITALS — BP 120/85 | HR 64 | Temp 97.9°F | Ht 71.0 in | Wt 266.4 lb

## 2021-10-15 DIAGNOSIS — Z125 Encounter for screening for malignant neoplasm of prostate: Secondary | ICD-10-CM

## 2021-10-15 DIAGNOSIS — Z0000 Encounter for general adult medical examination without abnormal findings: Secondary | ICD-10-CM | POA: Diagnosis not present

## 2021-10-15 DIAGNOSIS — F17210 Nicotine dependence, cigarettes, uncomplicated: Secondary | ICD-10-CM

## 2021-10-15 DIAGNOSIS — R739 Hyperglycemia, unspecified: Secondary | ICD-10-CM

## 2021-10-15 LAB — CBC
HCT: 47.5 % (ref 39.0–52.0)
Hemoglobin: 16.1 g/dL (ref 13.0–17.0)
MCHC: 34 g/dL (ref 30.0–36.0)
MCV: 89 fl (ref 78.0–100.0)
Platelets: 216 10*3/uL (ref 150.0–400.0)
RBC: 5.34 Mil/uL (ref 4.22–5.81)
RDW: 13.1 % (ref 11.5–15.5)
WBC: 6.8 10*3/uL (ref 4.0–10.5)

## 2021-10-15 LAB — LIPID PANEL
Cholesterol: 113 mg/dL (ref 0–200)
HDL: 33.1 mg/dL — ABNORMAL LOW (ref >39.00)
LDL Cholesterol: 65 mg/dL (ref 0–99)
NonHDL: 80.35
Total CHOL/HDL Ratio: 3
Triglycerides: 77 mg/dL (ref 0.0–149.0)
VLDL: 15.4 mg/dL (ref 0.0–40.0)

## 2021-10-15 LAB — COMPREHENSIVE METABOLIC PANEL
ALT: 37 U/L (ref 0–53)
AST: 22 U/L (ref 0–37)
Albumin: 4.4 g/dL (ref 3.5–5.2)
Alkaline Phosphatase: 57 U/L (ref 39–117)
BUN: 16 mg/dL (ref 6–23)
CO2: 28 mEq/L (ref 19–32)
Calcium: 9.5 mg/dL (ref 8.4–10.5)
Chloride: 105 mEq/L (ref 96–112)
Creatinine, Ser: 0.93 mg/dL (ref 0.40–1.50)
GFR: 90.58 mL/min (ref >60.00)
Glucose, Bld: 151 mg/dL — ABNORMAL HIGH (ref 70–99)
Potassium: 4.4 mEq/L (ref 3.5–5.1)
Sodium: 142 mEq/L (ref 135–145)
Total Bilirubin: 1.1 mg/dL (ref 0.2–1.2)
Total Protein: 7.1 g/dL (ref 6.0–8.3)

## 2021-10-15 LAB — PSA: PSA: 0.83 ng/mL (ref 0.10–4.00)

## 2021-10-15 LAB — HEMOGLOBIN A1C: Hgb A1c MFr Bld: 6.8 % — ABNORMAL HIGH (ref 4.6–6.5)

## 2021-10-15 NOTE — Addendum Note (Signed)
Addended by: Susy Manor on: 01/14/1897 05:07 PM   Modules accepted: Orders

## 2021-10-15 NOTE — Patient Instructions (Addendum)
Give [place] 2-3 business days to get the results of your labs back.   Keep the diet clean and stay active.  Please schedule your colonoscopy at your convenience.   Please stop smoking and let me know if you want anything to help.  Let us know if you need anything.  EXERCISES  RANGE OF MOTION (ROM) AND STRETCHING EXERCISES These exercises may help you when beginning to rehabilitate your injury. While completing these exercises, remember:  Restoring tissue flexibility helps normal motion to return to the joints. This allows healthier, less painful movement and activity. An effective stretch should be held for at least 30 seconds. A stretch should never be painful. You should only feel a gentle lengthening or release in the stretched tissue.  ROM - Pendulum Bend at the waist so that your right / left arm falls away from your body. Support yourself with your opposite hand on a solid surface, such as a table or a countertop. Your right / left arm should be perpendicular to the ground. If it is not perpendicular, you need to lean over farther. Relax the muscles in your right / left arm and shoulder as much as possible. Gently sway your hips and trunk so they move your right / left arm without any use of your right / left shoulder muscles. Progress your movements so that your right / left arm moves side to side, then forward and backward, and finally, both clockwise and counterclockwise. Complete 10-15 repetitions in each direction. Many people use this exercise to relieve discomfort in their shoulder as well as to gain range of motion. Repeat 2 times. Complete this exercise 3 times per week.  STRETCH - Flexion, Standing Stand with good posture. With an underhand grip on your right / left hand and an overhand grip on the opposite hand, grasp a broomstick or cane so that your hands are a little more than shoulder-width apart. Keeping your right / left elbow straight and shoulder muscles relaxed, push  the stick with your opposite hand to raise your right / left arm in front of your body and then overhead. Raise your arm until you feel a stretch in your right / left shoulder, but before you have increased shoulder pain. Try to avoid shrugging your right / left shoulder as your arm rises by keeping your shoulder blade tucked down and toward your mid-back spine. Hold 30 seconds. Slowly return to the starting position. Repeat 2 times. Complete this exercise 3 times per week.  STRETCH - Internal Rotation Place your right / left hand behind your back, palm-up. Throw a towel or belt over your opposite shoulder. Grasp the towel/belt with your right / left hand. While keeping an upright posture, gently pull up on the towel/belt until you feel a stretch in the front of your right / left shoulder. Avoid shrugging your right / left shoulder as your arm rises by keeping your shoulder blade tucked down and toward your mid-back spine. Hold 30. Release the stretch by lowering your opposite hand. Repeat 2 times. Complete this exercise 3 times per week.  STRETCH - External Rotation and Abduction Stagger your stance through a doorframe. It does not matter which foot is forward. As instructed by your physician, physical therapist or athletic trainer, place your hands: And forearms above your head and on the door frame. And forearms at head-height and on the door frame. At elbow-height and on the door frame. Keeping your head and chest upright and your stomach muscles tight  to prevent over-extending your low-back, slowly shift your weight onto your front foot until you feel a stretch across your chest and/or in the front of your shoulders. Hold 30 seconds. Shift your weight to your back foot to release the stretch. Repeat 2 times. Complete this stretch 3 times per week.   STRENGTHENING EXERCISES  These exercises may help you when beginning to rehabilitate your injury. They may resolve your symptoms with or  without further involvement from your physician, physical therapist or athletic trainer. While completing these exercises, remember:  Muscles can gain both the endurance and the strength needed for everyday activities through controlled exercises. Complete these exercises as instructed by your physician, physical therapist or athletic trainer. Progress the resistance and repetitions only as guided. You may experience muscle soreness or fatigue, but the pain or discomfort you are trying to eliminate should never worsen during these exercises. If this pain does worsen, stop and make certain you are following the directions exactly. If the pain is still present after adjustments, discontinue the exercise until you can discuss the trouble with your clinician. If advised by your physician, during your recovery, avoid activity or exercises which involve actions that place your right / left hand or elbow above your head or behind your back or head. These positions stress the tissues which are trying to heal.  STRENGTH - Scapular Depression and Adduction With good posture, sit on a firm chair. Supported your arms in front of you with pillows, arm rests or a table top. Have your elbows in line with the sides of your body. Gently draw your shoulder blades down and toward your mid-back spine. Gradually increase the tension without tensing the muscles along the top of your shoulders and the back of your neck. Hold for 3 seconds. Slowly release the tension and relax your muscles completely before completing the next repetition. After you have practiced this exercise, remove the arm support and complete it in standing as well as sitting. Repeat 2 times. Complete this exercise 3 times per week.   STRENGTH - External Rotators Secure a rubber exercise band/tubing to a fixed object so that it is at the same height as your right / left elbow when you are standing or sitting on a firm surface. Stand or sit so that the  secured exercise band/tubing is at your side that is not injured. Bend your elbow 90 degrees. Place a folded towel or small pillow under your right / left arm so that your elbow is a few inches away from your side. Keeping the tension on the exercise band/tubing, pull it away from your body, as if pivoting on your elbow. Be sure to keep your body steady so that the movement is only coming from your shoulder rotating. Hold 3 seconds. Release the tension in a controlled manner as you return to the starting position. Repeat 2 times. Complete this exercise 3 times per week.   STRENGTH - Supraspinatus Stand or sit with good posture. Grasp a 2-3 lb weight or an exercise band/tubing so that your hand is "thumbs-up," like when you shake hands. Slowly lift your right / left hand from your thigh into the air, traveling about 30 degrees from straight out at your side. Lift your hand to shoulder height or as far as you can without increasing any shoulder pain. Initially, many people do not lift their hands above shoulder height. Avoid shrugging your right / left shoulder as your arm rises by keeping your shoulder blade tucked  down and toward your mid-back spine. Hold for 3 seconds. Control the descent of your hand as you slowly return to your starting position. Repeat 2 times. Complete this exercise 3 times per week.   STRENGTH - Shoulder Extensors Secure a rubber exercise band/tubing so that it is at the height of your shoulders when you are either standing or sitting on a firm arm-less chair. With a thumbs-up grip, grasp an end of the band/tubing in each hand. Straighten your elbows and lift your hands straight in front of you at shoulder height. Step back away from the secured end of band/tubing until it becomes tense. Squeezing your shoulder blades together, pull your hands down to the sides of your thighs. Do not allow your hands to go behind you. Hold for 3 seconds. Slowly ease the tension on the  band/tubing as you reverse the directions and return to the starting position. Repeat 2 times. Complete this exercise 3 times per week.   STRENGTH - Scapular Retractors Secure a rubber exercise band/tubing so that it is at the height of your shoulders when you are either standing or sitting on a firm arm-less chair. With a palm-down grip, grasp an end of the band/tubing in each hand. Straighten your elbows and lift your hands straight in front of you at shoulder height. Step back away from the secured end of band/tubing until it becomes tense. Squeezing your shoulder blades together, draw your elbows back as you bend them. Keep your upper arm lifted away from your body throughout the exercise. Hold 3 seconds. Slowly ease the tension on the band/tubing as you reverse the directions and return to the starting position. Repeat 2 times. Complete this exercise 3 times per week.  STRENGTH - Scapular Depressors Find a sturdy chair without wheels, such as a from a dining room table. Keeping your feet on the floor, lift your bottom from the seat and lock your elbows. Keeping your elbows straight, allow gravity to pull your body weight down. Your shoulders will rise toward your ears. Raise your body against gravity by drawing your shoulder blades down your back, shortening the distance between your shoulders and ears. Although your feet should always maintain contact with the floor, your feet should progressively support less body weight as you get stronger. Hold 3 seconds. In a controlled and slow manner, lower your body weight to begin the next repetition. Repeat 2 times. Complete this exercise 3 times per week.    This information is not intended to replace advice given to you by your health care provider. Make sure you discuss any questions you have with your health care provider.   Document Released: 03/23/2005 Document Revised: 05/30/2014 Document Reviewed: 08/21/2008 Elsevier Interactive Patient  Education [employer].

## 2021-10-15 NOTE — Progress Notes (Signed)
Chief Complaint  Patient presents with   Annual Exam    Well Male [name] is here for a complete physical.   His last physical was >1 year ago.  Current diet: in general, a "healthy" diet.  Current exercise: wt resistance exercise, active at work Weight trend: down a few lbs Fatigue out of ordinary? No. Seat belt? Yes.   Advanced directive? No  Health maintenance Shingrix- Yes Colonoscopy- Due for repeat Tetanus- Yes HIV- Yes Hep C- Yes Lung cancer screening- Yes   Past Medical History:  Diagnosis Date   Diabetes mellitus without complication (HCC)    hx- borderline- diet controlled    GERD (gastroesophageal reflux disease)    Headache    Hyperlipidemia    Obesity    Tobacco use       Past Surgical History:  Procedure Laterality Date   BACK SURGERY  02/2008   WISDOM TOOTH EXTRACTION      Medications  Current Outpatient Medications on File Prior to Visit  Medication Sig Dispense Refill   pantoprazole (PROTONIX) 40 MG tablet TAKE 1 TABLET (40 MG TOTAL) BY MOUTH DAILY. 90 tablet 2   rosuvastatin (CRESTOR) 20 MG tablet TAKE 1 TABLET (20 MG TOTAL) BY MOUTH DAILY. 90 tablet 2   sertraline (ZOLOFT) 50 MG tablet Take 1 tablet (50 mg total) by mouth daily. 90 tablet 1   Allergies No Known Allergies  Family History Family History  Problem Relation Age of Onset   Diabetes Mother    Prostate cancer Father    Diabetes Sister    Heart disease Sister 25       MI, pacemaker   Other Brother        died in motorcycle accident   Bladder Cancer Brother    Colon cancer Neg Hx    Colon polyps Neg Hx    Rectal cancer Neg Hx    Stomach cancer Neg Hx     Review of Systems: Constitutional:  no fevers Eye:  no recent significant change in vision Ear/Nose/Mouth/Throat:  Ears:  no hearing loss Nose/Mouth/Throat:  no complaints of nasal congestion, no sore throat Cardiovascular:  no chest pain Respiratory:  no shortness of breath Gastrointestinal:  no change in  bowel habits GU:  Male: negative for dysuria, frequency Musculoskeletal/Extremities:  no joint pain Integumentary (Skin/Breast):  no abnormal skin lesions reported Neurologic:  no headaches Endocrine: No unexpected weight changes Hematologic/Lymphatic:  no abnormal bleeding  Exam BP 120/85   Pulse 64   Temp 97.9 F (36.6 C) (Oral)   Ht '5\' 11"'$  (1.803 m)   Wt 266 lb 6 oz (120.8 kg)   SpO2 97%   BMI 37.15 kg/m  General:  well developed, well nourished, in no apparent distress Skin:  no significant moles, warts, or growths Head:  no masses, lesions, or tenderness Eyes:  pupils equal and round, sclera anicteric without injection Ears:  canals without lesions, TMs shiny without retraction, no obvious effusion, no erythema Nose:  nares patent, septum midline, mucosa normal Throat/Pharynx:  lips and gingiva without lesion; tongue and uvula midline; non-inflamed pharynx; no exudates or postnasal drainage Neck: neck supple without adenopathy, thyromegaly, or masses Cardiac: RRR, no bruits, no LE edema Lungs:  clear to auscultation, breath sounds equal bilaterally, no respiratory distress Abdomen: BS+, soft, non-tender, non-distended, no masses or organomegaly noted Rectal: Deferred Musculoskeletal:  symmetrical muscle groups noted without atrophy or deformity Neuro:  gait normal; deep tendon reflexes normal and symmetric Psych: well oriented with  normal range of affect and appropriate judgment/insight  Assessment and Plan  Well adult exam - Plan: CBC, Comprehensive metabolic panel, Lipid panel  Screening for prostate cancer - Plan: PSA  Smokes with greater than 30 pack year history - Plan: Ambulatory Referral Lung Cancer Screening [facility]   Well 56 y.o. male. Counseled on diet and exercise. Counseled on risks and benefits of prostate cancer screening with PSA. The patient agrees to undergo testing. Will set up with lung cancer screening again. Counseled on tobacco  cessation. Offered aid w tx but he politely declined at this time. Advanced directive form provided today.  He will reach out to GI for his f/u colonoscopy.  Immunizations, labs, and further orders as above. Follow up in 6 mo. The patient voiced understanding and agreement to the plan.  [place], DO 10/15/21 10:27 AM

## 2021-10-15 NOTE — Addendum Note (Signed)
Addended by: Susy Manor on: 4/49/6759 05:06 PM   Modules accepted: Orders

## 2021-10-29 ENCOUNTER — Encounter: Payer: Self-pay | Admitting: Family Medicine

## 2021-10-29 ENCOUNTER — Telehealth: Payer: Self-pay | Admitting: Family Medicine

## 2021-10-29 ENCOUNTER — Other Ambulatory Visit (HOSPITAL_BASED_OUTPATIENT_CLINIC_OR_DEPARTMENT_OTHER): Payer: Self-pay

## 2021-10-29 ENCOUNTER — Ambulatory Visit (INDEPENDENT_AMBULATORY_CARE_PROVIDER_SITE_OTHER): Payer: No Typology Code available for payment source | Admitting: Family Medicine

## 2021-10-29 VITALS — BP 132/80 | HR 63 | Temp 97.7°F | Resp 18 | Ht 71.0 in | Wt 267.5 lb

## 2021-10-29 DIAGNOSIS — I700 Atherosclerosis of aorta: Secondary | ICD-10-CM | POA: Diagnosis not present

## 2021-10-29 DIAGNOSIS — E1165 Type 2 diabetes mellitus with hyperglycemia: Secondary | ICD-10-CM | POA: Diagnosis not present

## 2021-10-29 DIAGNOSIS — E119 Type 2 diabetes mellitus without complications: Secondary | ICD-10-CM | POA: Insufficient documentation

## 2021-10-29 LAB — MICROALBUMIN / CREATININE URINE RATIO
Creatinine,U: 140 mg/dL
Microalb Creat Ratio: 0.8 mg/g (ref 0.0–30.0)
Microalb, Ur: 1.2 mg/dL (ref 0.0–1.9)

## 2021-10-29 MED ORDER — ROSUVASTATIN CALCIUM 20 MG PO TABS
ORAL_TABLET | Freq: Every day | ORAL | 2 refills | Status: DC
  Filled 2021-10-29: qty 90, 90d supply, fill #0
  Filled 2022-01-30: qty 90, 90d supply, fill #1
  Filled 2022-06-06: qty 90, 90d supply, fill #2

## 2021-10-29 MED ORDER — OZEMPIC (0.25 OR 0.5 MG/DOSE) 2 MG/3ML ~~LOC~~ SOPN
PEN_INJECTOR | SUBCUTANEOUS | 0 refills | Status: DC
Start: 2021-10-29 — End: 2021-12-10
  Filled 2021-10-29: qty 3, 42d supply, fill #0

## 2021-10-29 NOTE — Patient Instructions (Addendum)
Per our records you are due for your diabetic eye exam. Please contact your eye doctor to schedule an appointment. Please have them send copies of your office visit notes to [place]. Our fax number is [phone]. If you need a referral to an eye doctor please let us know.  Go to the Ozempic website to get a coupon to bring your copay to 0 hopefully. Let me know if there are cost issues.  Keep the diet clean and stay active.  Send me a message in 1-2 weeks if you still haven't heard anything about your lung scan.  Let us know if you need anything.

## 2021-10-29 NOTE — Progress Notes (Signed)
Chief Complaint  Patient presents with   Diabetes    Discuss results    Subjective: Patient is a 56 y.o. male here for f/u high sugars.  Elevated fasting glucose, A1c was 6.8. No dx of DM prior. He does follow with an eye doctor. He is on Crestor. He has received a PCV.   Past Medical History:  Diagnosis Date   Diabetes mellitus without complication (HCC)    hx- borderline- diet controlled    GERD (gastroesophageal reflux disease)    Headache    Hyperlipidemia    Obesity    Tobacco use     Objective: BP 132/80   Pulse 63   Temp 97.7 F (36.5 C) (Oral)   Resp 18   Ht '5\' 11"'$  (1.803 m)   Wt 267 lb 8 oz (121.3 kg)   SpO2 96%   BMI 37.31 kg/m  General: Awake, appears stated age Heart: RRR, no LE edema Lungs: CTAB, no rales, wheezes or rhonchi. No accessory muscle use Skin: No skin lesions on foot, callous buildup on tip of R great toe Neuro: Sensation intact to pinprick over b/l feet Psych: Age appropriate judgment and insight, normal affect and mood  Assessment and Plan: Type 2 diabetes mellitus with hyperglycemia, without long-term current use of insulin ([place]) - Plan: Semaglutide,0.25 or 0.'5MG'$ /DOS, (OZEMPIC, 0.25 OR 0.5 MG/DOSE,) 2 MG/3ML SOPN, Microalbumin / creatinine urine ratio  Class 2 severe obesity with body mass index (BMI) of 35 to 39.9 with serious comorbidity (HCC)  Aortic atherosclerosis ([place]) - Plan: rosuvastatin (CRESTOR) 20 MG tablet  Chronic, new issue. Start Ozempic to help with this and #2. Cont statin. PCV UTD for now. Ck urine. Foot exam WNL.  Counseled on diet/exercise. Ozempic as above. F/u in 6 weeks to reck.  Cont statin.  The patient voiced understanding and agreement to the plan.  [place], DO 10/29/21  9:46 AM

## 2021-10-29 NOTE — Telephone Encounter (Signed)
Initiated PA for Ozempic KEY:  [id number] Waiting reponse

## 2021-11-01 NOTE — Telephone Encounter (Signed)
Called the patient informed medication has been approved

## 2021-11-01 NOTE — Telephone Encounter (Signed)
Medication approved from 10/31/2021 to 10/31/2022. As long as the patient is enrolled as a member of his  current health plan.

## 2021-11-02 ENCOUNTER — Other Ambulatory Visit (HOSPITAL_BASED_OUTPATIENT_CLINIC_OR_DEPARTMENT_OTHER): Payer: Self-pay

## 2021-12-10 ENCOUNTER — Encounter: Payer: Self-pay | Admitting: Family Medicine

## 2021-12-10 ENCOUNTER — Other Ambulatory Visit (HOSPITAL_BASED_OUTPATIENT_CLINIC_OR_DEPARTMENT_OTHER): Payer: Self-pay

## 2021-12-10 ENCOUNTER — Ambulatory Visit (INDEPENDENT_AMBULATORY_CARE_PROVIDER_SITE_OTHER): Payer: No Typology Code available for payment source | Admitting: Family Medicine

## 2021-12-10 VITALS — BP 118/82 | HR 72 | Temp 98.0°F | Ht 71.0 in | Wt 268.0 lb

## 2021-12-10 DIAGNOSIS — E1165 Type 2 diabetes mellitus with hyperglycemia: Secondary | ICD-10-CM

## 2021-12-10 DIAGNOSIS — Z1211 Encounter for screening for malignant neoplasm of colon: Secondary | ICD-10-CM

## 2021-12-10 DIAGNOSIS — F17210 Nicotine dependence, cigarettes, uncomplicated: Secondary | ICD-10-CM | POA: Diagnosis not present

## 2021-12-10 DIAGNOSIS — J439 Emphysema, unspecified: Secondary | ICD-10-CM

## 2021-12-10 MED ORDER — SEMAGLUTIDE (1 MG/DOSE) 4 MG/3ML ~~LOC~~ SOPN
1.0000 mg | PEN_INJECTOR | SUBCUTANEOUS | 4 refills | Status: DC
  Filled 2021-12-10: qty 3, 28d supply, fill #0
  Filled 2022-01-07: qty 3, 28d supply, fill #1
  Filled 2022-01-30: qty 3, 28d supply, fill #2
  Filled 2022-03-04: qty 3, 28d supply, fill #3
  Filled 2022-04-01: qty 3, 28d supply, fill #4

## 2021-12-10 NOTE — Patient Instructions (Addendum)
Keep the diet clean and stay active.  We are moving up to 1 mg weekly of the Ozempic. If you are on this strength for 4 weeks, doing well and want to increase to 2 mg weekly, let me know and we will send it in.   If you do not hear anything about your referrals in the next 1-2 weeks, call our office and ask for an update.  Let us know if you need anything.

## 2021-12-10 NOTE — Progress Notes (Signed)
Chief Complaint  Patient presents with   Follow-up    6 week    Subjective: Patient is a 56 y.o. male here for f/u.  Pt dx'd w diabetes around 6 weeks ago and started on Ozempic for both blood sugar control and weight loss. He reports compliance without adverse effects. It has not helped him lose weight. It has helped with his appetite. Currently on 0.5 mg/week. Interested in increasing if possible.   Has had trouble getting back in w GI doc. Due for repeat CCS.  Has had trouble getting an eye exam that accepts his ins.  Has not heard anything about CT lung for CA screening.   Past Medical History:  Diagnosis Date   Diabetes mellitus without complication (HCC)    hx- borderline- diet controlled    GERD (gastroesophageal reflux disease)    Headache    Hyperlipidemia    Obesity    Tobacco use     Objective: BP 118/82   Pulse 72   Temp 98 F (36.7 C) (Oral)   Ht '5\' 11"'$  (1.803 m)   Wt 268 lb (121.6 kg)   SpO2 95%   BMI 37.38 kg/m  General: Awake, appears stated age Heart: RRR, no LE edema Lungs: CTAB, no rales, wheezes or rhonchi. No accessory muscle use Psych: Age appropriate judgment and insight, normal affect and mood  Assessment and Plan: Class 2 severe obesity with body mass index (BMI) of 35 to 39.9 with serious comorbidity (HCC) - Plan: Semaglutide, 1 MG/DOSE, 4 MG/3ML SOPN  Smokes with greater than 30 pack year history - Plan: CT CHEST LUNG CANCER SCREENING LOW DOSE WO CONTRAST  Screen for colon cancer - Plan: Ambulatory referral to Gastroenterology  Type 2 diabetes mellitus with hyperglycemia, without long-term current use of insulin (HCC) - Plan: Semaglutide, 1 MG/DOSE, 4 MG/3ML SOPN, Ambulatory referral to Ophthalmology  Pulmonary emphysema, unspecified emphysema type ([place]), Chronic  Obesity: Chronic, unstable. Counseled on diet/exercise. Increase Ozempic from 0.5 mg/week to 1 mg/d. OK to send message in 1 mo if doing well on this strength and we can  increase to 2 mg/week.l Refer to ophtho today. CCS: refer GI again. Smoking hx: Tried to refer to pulm clinic to order CT, did not work so will place order ourselves as that was successful in the past.  F/u in 4 mo for DM visit or prn.  The patient voiced understanding and agreement to the plan.  [place], DO 12/10/21  9:02 AM

## 2021-12-14 ENCOUNTER — Other Ambulatory Visit: Payer: Self-pay

## 2021-12-14 DIAGNOSIS — F17210 Nicotine dependence, cigarettes, uncomplicated: Secondary | ICD-10-CM

## 2021-12-14 DIAGNOSIS — Z122 Encounter for screening for malignant neoplasm of respiratory organs: Secondary | ICD-10-CM

## 2021-12-14 DIAGNOSIS — Z87891 Personal history of nicotine dependence: Secondary | ICD-10-CM

## 2021-12-21 ENCOUNTER — Encounter: Payer: Self-pay | Admitting: Gastroenterology

## 2021-12-31 ENCOUNTER — Ambulatory Visit (INDEPENDENT_AMBULATORY_CARE_PROVIDER_SITE_OTHER): Payer: No Typology Code available for payment source | Admitting: Acute Care

## 2021-12-31 ENCOUNTER — Ambulatory Visit (HOSPITAL_BASED_OUTPATIENT_CLINIC_OR_DEPARTMENT_OTHER)
Admission: RE | Admit: 2021-12-31 | Discharge: 2021-12-31 | Disposition: A | Discharge status: Home / Self Care | Payer: No Typology Code available for payment source | Source: Ambulatory Visit | Attending: Acute Care | Admitting: Acute Care

## 2021-12-31 ENCOUNTER — Encounter: Payer: Self-pay | Admitting: Acute Care

## 2021-12-31 DIAGNOSIS — F17210 Nicotine dependence, cigarettes, uncomplicated: Secondary | ICD-10-CM | POA: Insufficient documentation

## 2021-12-31 DIAGNOSIS — Z122 Encounter for screening for malignant neoplasm of respiratory organs: Secondary | ICD-10-CM | POA: Insufficient documentation

## 2021-12-31 DIAGNOSIS — Z87891 Personal history of nicotine dependence: Secondary | ICD-10-CM

## 2021-12-31 NOTE — Progress Notes (Signed)
Virtual Visit via Telephone Note  I connected with [name] on 04/06/21 at  2:00 PM EST by telephone and verified that I am speaking with the correct person using two identifiers.  Location: Patient: Home Provider: Working from    I discussed the limitations, risks, security and privacy concerns of performing an evaluation and management service by telephone and the availability of in person appointments. I also discussed with the patient that there may be a patient responsible charge related to this service. The patient expressed understanding and agreed to proceed.  Shared Decision Making Visit Lung Cancer Screening Program [phone])   Eligibility: Age 56 y.o. Pack Years Smoking History Calculation 35 (# packs/per year x # years smoked) Recent History of coughing up blood  no Unexplained weight loss? no ( >Than 15 pounds within the last 6 months ) Prior History Lung / other cancer no (Diagnosis within the last 5 years already requiring surveillance chest CT Scans). Smoking Status Current Smoker Former Smokers: Years since quit: NA  Quit Date: NA  Visit Components: Discussion included one or more decision making aids. yes Discussion included risk/benefits of screening. yes Discussion included potential follow up diagnostic testing for abnormal scans. yes Discussion included meaning and risk of over diagnosis. yes Discussion included meaning and risk of False Positives. yes Discussion included meaning of total radiation exposure. yes  Counseling Included: Importance of adherence to annual lung cancer LDCT screening. yes Impact of comorbidities on ability to participate in the program. yes Ability and willingness to under diagnostic treatment. yes  Smoking Cessation Counseling: Current Smokers:  Discussed importance of smoking cessation. yes Information about tobacco cessation classes and interventions provided to patient. yes Patient provided with "ticket" for LDCT  Scan. yes Symptomatic Patient. yes  Counseling(Intermediate counseling: > three minutes) 99406 Diagnosis Code: Tobacco Use Z72.0 Asymptomatic Patient no  Counseling NA Former Smokers:  Discussed the importance of maintaining cigarette abstinence. yes Diagnosis Code: Personal History of Nicotine Dependence. I62.703 Information about tobacco cessation classes and interventions provided to patient. Yes Patient provided with "ticket" for LDCT Scan. yes Written Order for Lung Cancer Screening with LDCT placed in Epic. Yes (CT Chest Lung Cancer Screening Low Dose W/O CM) [id number] Z12.2-Screening of respiratory organs Z87.891-Personal history of nicotine dependence   I spent 25 minutes of face to face time with him discussing the risks and benefits of lung cancer screening. We viewed a power point together that explained in detail the above noted topics. We took the time to pause the power point at intervals to allow for questions to be asked and answered to ensure understanding. We discussed that he had taken the single most powerful action possible to decrease his risk of developing lung cancer when he quit smoking. I counseled him to remain smoke free, and to contact me if he ever had the desire to smoke again so that I can provide resources and tools to help support the effort to remain smoke free. We discussed the time and location of the scan, and that either  Doroteo Glassman RN or I will call with the results within  24-48 hours of receiving them. He has my card and contact information in the event he needs to speak with me, in addition to a copy of the power point we reviewed as a resource. He verbalized understanding of all of the above and had no further questions upon leaving the office.     I explained to the patient that there has been a  high incidence of coronary artery disease noted on these exams. I explained that this is a non-gated exam therefore degree or severity cannot be determined.  This patient is on statin therapy. I have asked the patient to follow-up with their PCP regarding any incidental finding of coronary artery disease and management with diet or medication as they feel is clinically indicated. The patient verbalized understanding of the above and had no further questions.    I spent 3 minutes counseling on smoking cessation and the health risks of continued tobacco abuse    Jaslyne Beeck D. Kenton Kingfisher, NP-C  [facility] Personal contact information can be found on Amion  12/31/2021, 8:38 AM

## 2021-12-31 NOTE — Patient Instructions (Signed)
Thank you for participating in the [facility]. It was our pleasure to meet you today. We will call you with the results of your scan within the next few days. Your scan will be assigned a Lung RADS category score by the physicians reading the scans.  This Lung RADS score determines follow up scanning.  See below for description of categories, and follow up screening recommendations. We will be in touch to schedule your follow up screening annually or based on recommendations of our providers. We will fax a copy of your scan results to your Primary Care Physician, or the physician who referred you to the program, to ensure they have the results. Please call the office if you have any questions or concerns regarding your scanning experience or results.  Our office number is [phone]. Please speak with Denise Phelps, RN. , or  Denise Buckner RN, They are  our Lung Cancer Screening RN.'s If They are unavailable when you call, Please leave a message on the voice mail. We will return your call at our earliest convenience.This voice mail is monitored several times a day.  Remember, if your scan is normal, we will scan you annually as long as you continue to meet the criteria for the program. (Age 55-77, Current smoker or smoker who has quit within the last 15 years). If you are a smoker, remember, quitting is the single most powerful action that you can take to decrease your risk of lung cancer and other pulmonary, breathing related problems. We know quitting is hard, and we are here to help.  Please let us know if there is anything we can do to help you meet your goal of quitting. If you are a former smoker, congratulations. We are proud of you! Remain smoke free! Remember you can refer friends or family members through the number above.  We will screen them to make sure they meet criteria for the program. Thank you for helping us take better care of you by  participating in Lung Screening.  You can receive free nicotine replacement therapy ( patches, gum or mints) by calling 1-800-QUIT NOW. Please call so we can get you on the path to becoming  a non-smoker. I know it is hard, but you can do this!  Lung RADS Categories:  Lung RADS 1: no nodules or definitely non-concerning nodules.  Recommendation is for a repeat annual scan in 12 months.  Lung RADS 2:  nodules that are non-concerning in appearance and behavior with a very low likelihood of becoming an active cancer. Recommendation is for a repeat annual scan in 12 months.  Lung RADS 3: nodules that are probably non-concerning , includes nodules with a low likelihood of becoming an active cancer.  Recommendation is for a 6-month repeat screening scan. Often noted after an upper respiratory illness. We will be in touch to make sure you have no questions, and to schedule your 6-month scan.  Lung RADS 4 A: nodules with concerning findings, recommendation is most often for a follow up scan in 3 months or additional testing based on our provider's assessment of the scan. We will be in touch to make sure you have no questions and to schedule the recommended 3 month follow up scan.  Lung RADS 4 B:  indicates findings that are concerning. We will be in touch with you to schedule additional diagnostic testing based on our provider's  assessment of the scan.  Other options for assistance in smoking cessation (   As covered by your insurance benefits)  Hypnosis for smoking cessation  [employer] [phone]  Acupuncture for smoking cessation  [facility] [phone]   

## 2022-01-03 ENCOUNTER — Other Ambulatory Visit: Payer: Self-pay | Admitting: Acute Care

## 2022-01-03 DIAGNOSIS — F17210 Nicotine dependence, cigarettes, uncomplicated: Secondary | ICD-10-CM

## 2022-01-03 DIAGNOSIS — Z87891 Personal history of nicotine dependence: Secondary | ICD-10-CM

## 2022-01-03 DIAGNOSIS — Z122 Encounter for screening for malignant neoplasm of respiratory organs: Secondary | ICD-10-CM

## 2022-01-07 ENCOUNTER — Other Ambulatory Visit: Payer: Self-pay | Admitting: Family Medicine

## 2022-01-07 ENCOUNTER — Other Ambulatory Visit (HOSPITAL_BASED_OUTPATIENT_CLINIC_OR_DEPARTMENT_OTHER): Payer: Self-pay

## 2022-01-07 DIAGNOSIS — K219 Gastro-esophageal reflux disease without esophagitis: Secondary | ICD-10-CM

## 2022-01-07 MED ORDER — PANTOPRAZOLE SODIUM 40 MG PO TBEC
DELAYED_RELEASE_TABLET | Freq: Every day | ORAL | 2 refills | Status: DC
  Filled 2022-01-07: qty 90, 90d supply, fill #0
  Filled 2022-04-01: qty 90, 90d supply, fill #1
  Filled 2022-07-01: qty 90, 90d supply, fill #2

## 2022-01-21 ENCOUNTER — Other Ambulatory Visit (HOSPITAL_BASED_OUTPATIENT_CLINIC_OR_DEPARTMENT_OTHER): Payer: Self-pay

## 2022-01-21 ENCOUNTER — Ambulatory Visit (AMBULATORY_SURGERY_CENTER): Payer: Self-pay | Admitting: *Deleted

## 2022-01-21 ENCOUNTER — Other Ambulatory Visit (HOSPITAL_COMMUNITY): Payer: Self-pay

## 2022-01-21 VITALS — Ht 71.0 in | Wt 264.8 lb

## 2022-01-21 DIAGNOSIS — Z86010 Personal history of colonic polyps: Secondary | ICD-10-CM

## 2022-01-21 MED ORDER — NA SULFATE-K SULFATE-MG SULF 17.5-3.13-1.6 GM/177ML PO SOLN
1.0000 | Freq: Once | ORAL | 0 refills | Status: AC
  Filled 2022-01-21 (×2): qty 354, 1d supply, fill #0

## 2022-01-21 NOTE — Progress Notes (Signed)
No egg or soy allergy known to patient  No issues known to pt with past sedation with any surgeries or procedures Patient denies ever being told they had issues or difficulty with intubation  No FH of Malignant Hyperthermia Pt is not on diet pills Pt is not on home 02  Pt is not on blood thinners  Pt denies issues with constipation  No A fib or A flutter Have any cardiac testing pending--NO Pt instructed to use Single[employer]com or GoodRx for a price reduction on prep   

## 2022-01-31 ENCOUNTER — Other Ambulatory Visit (HOSPITAL_BASED_OUTPATIENT_CLINIC_OR_DEPARTMENT_OTHER): Payer: Self-pay

## 2022-02-14 ENCOUNTER — Encounter: Payer: Self-pay | Admitting: Gastroenterology

## 2022-02-18 ENCOUNTER — Ambulatory Visit (AMBULATORY_SURGERY_CENTER): Payer: No Typology Code available for payment source | Admitting: Gastroenterology

## 2022-02-18 ENCOUNTER — Encounter: Payer: Self-pay | Admitting: Gastroenterology

## 2022-02-18 VITALS — BP 129/85 | HR 74 | Temp 98.7°F | Resp 17 | Ht 71.0 in | Wt 264.8 lb

## 2022-02-18 DIAGNOSIS — D125 Benign neoplasm of sigmoid colon: Secondary | ICD-10-CM

## 2022-02-18 DIAGNOSIS — Z86010 Personal history of colonic polyps: Secondary | ICD-10-CM | POA: Diagnosis not present

## 2022-02-18 DIAGNOSIS — K635 Polyp of colon: Secondary | ICD-10-CM

## 2022-02-18 DIAGNOSIS — D128 Benign neoplasm of rectum: Secondary | ICD-10-CM

## 2022-02-18 DIAGNOSIS — K621 Rectal polyp: Secondary | ICD-10-CM | POA: Diagnosis not present

## 2022-02-18 DIAGNOSIS — Z09 Encounter for follow-up examination after completed treatment for conditions other than malignant neoplasm: Secondary | ICD-10-CM | POA: Diagnosis present

## 2022-02-18 MED ORDER — SODIUM CHLORIDE 0.9 % IV SOLN: 500.0000 mL | INTRAVENOUS | Status: DC

## 2022-02-18 NOTE — Progress Notes (Signed)
[facility] History and Physical   Primary Care Physician:  Shelda Pal, DO   Reason for Procedure:  History of adenomatous colon polyps  Plan:    Surveillance colonoscopy with possible interventions as needed     HPI: [name] is a very pleasant 56 y.o. male here for surveillance colonoscopy. Denies any nausea, vomiting, abdominal pain, melena or bright red blood per rectum  The risks and benefits as well as alternatives of endoscopic procedure(s) have been discussed and reviewed. All questions answered. The patient agrees to proceed.    Past Medical History:  Diagnosis Date   Diabetes mellitus without complication (HCC)    hx- borderline- diet controlled    GERD (gastroesophageal reflux disease)    Glaucoma    "TOUCH"   Headache    Hyperlipidemia    Obesity    Tobacco use     Past Surgical History:  Procedure Laterality Date   BACK SURGERY  02/2008   COLONOSCOPY     POLYPECTOMY     WISDOM TOOTH EXTRACTION      Prior to Admission medications   Medication Sig Start Date End Date Taking? Authorizing Provider  pantoprazole (PROTONIX) 40 MG tablet TAKE 1 TABLET (40 MG TOTAL) BY MOUTH DAILY. 01/07/22 01/07/23 Yes Wendling, [place] Oyster, DO  rosuvastatin (CRESTOR) 20 MG tablet TAKE 1 TABLET (20 MG TOTAL) BY MOUTH DAILY. 10/29/21 10/29/22 Yes Wendling, [place] Oyster, DO  Semaglutide, 1 MG/DOSE, 4 MG/3ML SOPN Inject 1 mg as directed once a week. 12/10/21  Yes Shelda Pal, DO    Current Outpatient Medications  Medication Sig Dispense Refill   pantoprazole (PROTONIX) 40 MG tablet TAKE 1 TABLET (40 MG TOTAL) BY MOUTH DAILY. 90 tablet 2   rosuvastatin (CRESTOR) 20 MG tablet TAKE 1 TABLET (20 MG TOTAL) BY MOUTH DAILY. 90 tablet 2   Semaglutide, 1 MG/DOSE, 4 MG/3ML SOPN Inject 1 mg as directed once a week. 3 mL 4   Current Facility-Administered Medications  Medication Dose Route Frequency Provider Last Rate Last Admin   0.9 %  sodium  chloride infusion  500 mL Intravenous Continuous Selita Staiger, Venia Minks, MD        Allergies as of 02/18/2022   (No Known Allergies)    Family History  Problem Relation Age of Onset   Diabetes Mother    Prostate cancer Father    Diabetes Sister    Heart disease Sister 20       MI, pacemaker   Other Brother        died in motorcycle accident   Bladder Cancer Brother    Colon cancer Neg Hx    Colon polyps Neg Hx    Rectal cancer Neg Hx    Stomach cancer Neg Hx    Crohn's disease Neg Hx    Esophageal cancer Neg Hx    Ulcerative colitis Neg Hx     Social History   Socioeconomic History   Marital status: Married    Spouse name: Not on file   Number of children: Not on file   Years of education: Not on file   Highest education level: Not on file  Occupational History   Not on file  Tobacco Use   Smoking status: Every Day    Packs/day: 1.00    Years: 35.00    Total pack years: 35.00    Types: Cigarettes   Smokeless tobacco: Never  Vaping Use   Vaping Use: Never used  Substance and Sexual Activity  Alcohol use: Yes    Comment: occasional   Drug use: No   Sexual activity: Not on file  Other Topics Concern   Not on file  Social History Narrative   Married, works in Architect   Social Determinants of Radio broadcast assistant Strain: Not on file  Food Insecurity: Not on file  Transportation Needs: Not on file  Physical Activity: Not on file  Stress: Not on file  Social Connections: Not on file  Intimate Partner Violence: Not on file    Review of Systems:  All other review of systems negative except as mentioned in the HPI.  Physical Exam: Vital signs in last 24 hours: Blood Pressure 132/72   Pulse 80   Temperature 98.7 F (37.1 C) (Temporal)   Height '5\' 11"'$  (1.803 m)   Weight 264 lb 12.8 oz (120.1 kg)   Oxygen Saturation 96%   Body Mass Index 36.93 kg/m  General:   Alert, NAD Lungs:  Clear .   Heart:  Regular rate and rhythm Abdomen:  Soft,  nontender and nondistended. Neuro/Psych:  Alert and cooperative. Normal mood and affect. A and O x 3  Reviewed labs, radiology imaging, old records and pertinent past GI work up  Patient is appropriate for planned procedure(s) and anesthesia in an ambulatory setting   K. Denzil Magnuson , MD [phone]

## 2022-02-18 NOTE — Progress Notes (Signed)
Called to room to assist during endoscopic procedure.  Patient ID and intended procedure confirmed with present staff. Received instructions for my participation in the procedure from the performing physician.  

## 2022-02-18 NOTE — Progress Notes (Signed)
Pt awake, alert and oriented. VSS. Airway intact. SBAR complete to RN. All questions answered.  

## 2022-02-18 NOTE — Progress Notes (Signed)
Pt's states no medical or surgical changes since previsit or office visit. 

## 2022-02-18 NOTE — Patient Instructions (Signed)
  Thank you for coming in to see [place] today! Resume your previous diet and medications/supplements today. Return to normal daily activities tomorrow. Recommendation for next colonoscopy will be made once polyp biopsy results are final.   YOU HAD AN ENDOSCOPIC PROCEDURE TODAY AT [employer]:   Refer to the procedure report that was given to you for any specific questions about what was found during the examination.  If the procedure report does not answer your questions, please call your gastroenterologist to clarify.  If you requested that your care partner not be given the details of your procedure findings, then the procedure report has been included in a sealed envelope for you to review at your convenience later.  YOU SHOULD EXPECT: Some feelings of bloating in the abdomen. Passage of more gas than usual.  Walking can help get rid of the air that was put into your GI tract during the procedure and reduce the bloating. If you had a lower endoscopy (such as a colonoscopy or flexible sigmoidoscopy) you may notice spotting of blood in your stool or on the toilet paper. If you underwent a bowel prep for your procedure, you may not have a normal bowel movement for a few days.  Please Note:  You might notice some irritation and congestion in your nose or some drainage.  This is from the oxygen used during your procedure.  There is no need for concern and it should clear up in a day or so.  SYMPTOMS TO REPORT IMMEDIATELY:  Following lower endoscopy (colonoscopy or flexible sigmoidoscopy):  Excessive amounts of blood in the stool  Significant tenderness or worsening of abdominal pains  Swelling of the abdomen that is new, acute  Fever of 100F or higher    For urgent or emergent issues, a gastroenterologist can be reached at any hour by calling [phone]. Do not use MyChart messaging for urgent concerns.    DIET:  We do recommend a small meal at first, but then you may  proceed to your regular diet.  Drink plenty of fluids but you should avoid alcoholic beverages for 24 hours.  ACTIVITY:  You should plan to take it easy for the rest of today and you should NOT DRIVE or use heavy machinery until tomorrow (because of the sedation medicines used during the test).    FOLLOW UP: Our staff will call the number listed on your records the next business day following your procedure.  We will call around 7:15- 8:00 am to check on you and address any questions or concerns that you may have regarding the information given to you following your procedure. If we do not reach you, we will leave a message.     If any biopsies were taken you will be contacted by phone or by letter within the next 1-3 weeks.  Please call us at [phone] if you have not heard about the biopsies in 3 weeks.    SIGNATURES/CONFIDENTIALITY: You and/or your care partner have signed paperwork which will be entered into your electronic medical record.  These signatures attest to the fact that that the information above on your After Visit Summary has been reviewed and is understood.  Full responsibility of the confidentiality of this discharge information lies with you and/or your care-partner.

## 2022-02-18 NOTE — Op Note (Signed)
[facility] Patient Name: [name] Procedure Date: 02/18/2022 9:15 AM MRN: [id number] Endoscopist: Mauri Pole , MD Age: 56 Referring MD:  Date of Birth: May 19, 1966 Gender: Male Account #: [id number] Procedure:                Colonoscopy Indications:              High risk colon cancer surveillance: Personal                            history of colonic polyps, High risk colon cancer                            surveillance: Personal history of adenoma (10 mm or                            greater in size), High risk colon cancer                            surveillance: Personal history of multiple (3 or                            more) adenomas Medicines:                Monitored Anesthesia Care Procedure:                Pre-Anesthesia Assessment:                           - Prior to the procedure, a History and Physical                            was performed, and patient medications and                            allergies were reviewed. The patient's tolerance of                            previous anesthesia was also reviewed. The risks                            and benefits of the procedure and the sedation                            options and risks were discussed with the patient.                            All questions were answered, and informed consent                            was obtained. Prior Anticoagulants: The patient has                            taken no previous anticoagulant or antiplatelet  agents. ASA Grade Assessment: III - A patient with                            severe systemic disease. After reviewing the risks                            and benefits, the patient was deemed in                            satisfactory condition to undergo the procedure.                           After obtaining informed consent, the colonoscope                            was passed under direct vision. Throughout the                             procedure, the patient's blood pressure, pulse, and                            oxygen saturations were monitored continuously. The                            PCF-HQ190L Colonoscope was introduced through the                            anus and advanced to the the cecum, identified by                            appendiceal orifice and ileocecal valve. The                            colonoscopy was performed without difficulty. The                            patient tolerated the procedure well. The quality                            of the bowel preparation was excellent. The                            ileocecal valve, appendiceal orifice, and rectum                            were photographed. Scope In: 9:30:07 AM Scope Out: 9:44:43 AM Scope Withdrawal Time: 0 hours 11 minutes 41 seconds  Total Procedure Duration: 0 hours 14 minutes 36 seconds  Findings:                 The perianal and digital rectal examinations were                            normal.  Two sessile polyps were found in the rectum and                            sigmoid colon. The polyps were 4 to 5 mm in size.                            These polyps were removed with a cold snare.                            Resection and retrieval were complete.                           A few small-mouthed diverticula were found in the                            sigmoid colon.                           Non-bleeding external and internal hemorrhoids were                            found during retroflexion. The hemorrhoids were                            medium-sized. Complications:            No immediate complications. Estimated Blood Loss:     Estimated blood loss was minimal. Impression:               - Two 4 to 5 mm polyps in the rectum and in the                            sigmoid colon, removed with a cold snare. Resected                            and retrieved.                            - Diverticulosis in the sigmoid colon.                           - Non-bleeding external and internal hemorrhoids. Recommendation:           - Patient has a contact number available for                            emergencies. The signs and symptoms of potential                            delayed complications were discussed with the                            patient. Return to normal activities tomorrow.  Written discharge instructions were provided to the                            patient.                           - Resume previous diet.                           - Continue present medications.                           - Await pathology results.                           - Repeat colonoscopy in 5 years for surveillance                            based on pathology results. Mauri Pole, MD 02/18/2022 9:50:23 AM This report has been signed electronically.

## 2022-02-21 ENCOUNTER — Telehealth: Payer: Self-pay | Admitting: *Deleted

## 2022-02-21 NOTE — Telephone Encounter (Signed)
  Follow up Call-     02/18/2022    8:24 AM  Call back number  Post procedure Call Back phone  # [phone]  Permission to leave phone message Yes     Patient questions:  Do you have a fever, pain , or abdominal swelling? No. Pain Score  0 *  Have you tolerated food without any problems? Yes.    Have you been able to return to your normal activities? Yes.    Do you have any questions about your discharge instructions: Diet   No. Medications  No. Follow up visit  No.  Do you have questions or concerns about your Care? No.  Actions: * If pain score is 4 or above: No action needed, pain <4.

## 2022-03-02 ENCOUNTER — Encounter: Payer: Self-pay | Admitting: Gastroenterology

## 2022-03-04 ENCOUNTER — Other Ambulatory Visit (HOSPITAL_BASED_OUTPATIENT_CLINIC_OR_DEPARTMENT_OTHER): Payer: Self-pay

## 2022-04-01 ENCOUNTER — Other Ambulatory Visit (HOSPITAL_BASED_OUTPATIENT_CLINIC_OR_DEPARTMENT_OTHER): Payer: Self-pay

## 2022-04-22 ENCOUNTER — Ambulatory Visit: Payer: No Typology Code available for payment source | Admitting: Family Medicine

## 2022-04-29 ENCOUNTER — Ambulatory Visit (INDEPENDENT_AMBULATORY_CARE_PROVIDER_SITE_OTHER): Payer: No Typology Code available for payment source | Admitting: Family Medicine

## 2022-04-29 ENCOUNTER — Other Ambulatory Visit (HOSPITAL_BASED_OUTPATIENT_CLINIC_OR_DEPARTMENT_OTHER): Payer: Self-pay

## 2022-04-29 ENCOUNTER — Encounter: Payer: Self-pay | Admitting: Family Medicine

## 2022-04-29 VITALS — BP 120/82 | HR 83 | Temp 98.1°F | Ht 71.0 in | Wt 259.2 lb

## 2022-04-29 DIAGNOSIS — Z23 Encounter for immunization: Secondary | ICD-10-CM

## 2022-04-29 DIAGNOSIS — I700 Atherosclerosis of aorta: Secondary | ICD-10-CM | POA: Diagnosis not present

## 2022-04-29 DIAGNOSIS — E1165 Type 2 diabetes mellitus with hyperglycemia: Secondary | ICD-10-CM

## 2022-04-29 LAB — COMPREHENSIVE METABOLIC PANEL
ALT: 35 U/L (ref 0–53)
AST: 19 U/L (ref 0–37)
Albumin: 4.5 g/dL (ref 3.5–5.2)
Alkaline Phosphatase: 55 U/L (ref 39–117)
BUN: 19 mg/dL (ref 6–23)
CO2: 27 mEq/L (ref 19–32)
Calcium: 9.7 mg/dL (ref 8.4–10.5)
Chloride: 109 mEq/L (ref 96–112)
Creatinine, Ser: 0.91 mg/dL (ref 0.40–1.50)
GFR: 92.63 mL/min (ref >60.00)
Glucose, Bld: 119 mg/dL — ABNORMAL HIGH (ref 70–99)
Potassium: 4.5 mEq/L (ref 3.5–5.1)
Sodium: 143 mEq/L (ref 135–145)
Total Bilirubin: 0.6 mg/dL (ref 0.2–1.2)
Total Protein: 7.2 g/dL (ref 6.0–8.3)

## 2022-04-29 LAB — LIPID PANEL
Cholesterol: 96 mg/dL (ref 0–200)
HDL: 35.7 mg/dL — ABNORMAL LOW (ref >39.00)
LDL Cholesterol: 46 mg/dL (ref 0–99)
NonHDL: 60.04
Total CHOL/HDL Ratio: 3
Triglycerides: 71 mg/dL (ref 0.0–149.0)
VLDL: 14.2 mg/dL (ref 0.0–40.0)

## 2022-04-29 LAB — HEMOGLOBIN A1C: Hgb A1c MFr Bld: 5.8 % (ref 4.6–6.5)

## 2022-04-29 MED ORDER — SEMAGLUTIDE (2 MG/DOSE) 8 MG/3ML ~~LOC~~ SOPN
2.0000 mg | PEN_INJECTOR | SUBCUTANEOUS | 2 refills | Status: DC
  Filled 2022-04-29: qty 3, 28d supply, fill #0
  Filled 2022-06-06: qty 3, 28d supply, fill #1
  Filled 2022-07-01: qty 3, 28d supply, fill #2

## 2022-04-29 NOTE — Patient Instructions (Addendum)
Give [place] 2-3 business days to get the results of your labs back.   Keep the diet clean and stay active.  Please schedule your eye exam at your convenience.   Keep stretching. Let me know if you want to see PT or Dr. Annette Stable again.   Let us know if you need anything.

## 2022-04-29 NOTE — Addendum Note (Signed)
Addended by: Sharon Seller B on: 04/29/2022 08:37 AM   Modules accepted: Orders

## 2022-04-29 NOTE — Progress Notes (Signed)
Subjective:   Chief Complaint  Patient presents with   Follow-up    [name] is a 55 y.o. male here for follow-up of diabetes.   [name] does not routinely monitor his sugars.  Patient does not require insulin.   Medications include: Ozempic 1 mg/week Diet is alright, decreased portions.  Exercise: stretching  Hyperlipidemia Patient presents for dyslipidemia follow up. Currently being treated with Crestor 20 mg/d and compliance with treatment thus far has been good. He denies myalgias. Diet/exercise as above.  The patient is not known to have coexisting coronary artery disease.  Past Medical History:  Diagnosis Date   Diabetes mellitus without complication (HCC)    hx- borderline- diet controlled    GERD (gastroesophageal reflux disease)    Glaucoma    "TOUCH"   Headache    Hyperlipidemia    Obesity    Tobacco use      Related testing: Retinal exam: Due Pneumovax: done  Objective:  BP 120/82 (BP Location: Left Arm, Patient Position: Sitting, Cuff Size: Normal)   Pulse 83   Temp 98.1 F (36.7 C) (Oral)   Ht '5\' 11"'$  (1.803 m)   Wt 259 lb 4 oz (117.6 kg)   SpO2 93%   BMI 36.16 kg/m  General:  Well developed, well nourished, in no apparent distress Skin:  Warm, no pallor or diaphoresis Lungs:  CTAB, no access msc use Cardio:  RRR, no bruits, no LE edema Psych: Age appropriate judgment and insight  Assessment:   Type 2 diabetes mellitus with hyperglycemia, without long-term current use of insulin (HCC) - Plan: Hemoglobin A1c, Lipid panel, Comprehensive metabolic panel  Aortic atherosclerosis (HCC)   Plan:   Chronic, stable. Increase Ozempic to 2 mg/week. Counseled on diet and exercise. Flu shot today. Need to sched eye exam, ins changing so will call after new year.  Chronic, stable. Cont Crestor 20 mg/d.  F/u in 6 mo. The patient voiced understanding and agreement to the plan.  [place], DO 04/29/22 8:33 AM

## 2022-05-06 ENCOUNTER — Other Ambulatory Visit (HOSPITAL_BASED_OUTPATIENT_CLINIC_OR_DEPARTMENT_OTHER): Payer: Self-pay

## 2022-07-01 ENCOUNTER — Other Ambulatory Visit: Payer: Self-pay

## 2022-07-01 ENCOUNTER — Encounter: Payer: Self-pay | Admitting: Nurse Practitioner

## 2022-07-01 ENCOUNTER — Telehealth: Payer: Self-pay | Admitting: Nurse Practitioner

## 2022-07-01 ENCOUNTER — Ambulatory Visit (INDEPENDENT_AMBULATORY_CARE_PROVIDER_SITE_OTHER): Payer: 59 | Admitting: Nurse Practitioner

## 2022-07-01 VITALS — BP 134/70 | HR 71 | Temp 97.8°F | Ht 70.47 in | Wt 247.6 lb

## 2022-07-01 DIAGNOSIS — I700 Atherosclerosis of aorta: Secondary | ICD-10-CM

## 2022-07-01 DIAGNOSIS — E119 Type 2 diabetes mellitus without complications: Secondary | ICD-10-CM

## 2022-07-01 DIAGNOSIS — J439 Emphysema, unspecified: Secondary | ICD-10-CM

## 2022-07-01 DIAGNOSIS — K219 Gastro-esophageal reflux disease without esophagitis: Secondary | ICD-10-CM | POA: Diagnosis not present

## 2022-07-01 DIAGNOSIS — E785 Hyperlipidemia, unspecified: Secondary | ICD-10-CM | POA: Insufficient documentation

## 2022-07-01 MED ORDER — SEMAGLUTIDE (2 MG/DOSE) 8 MG/3ML ~~LOC~~ SOPN
2.0000 mg | PEN_INJECTOR | SUBCUTANEOUS | 2 refills | Status: DC
  Filled 2022-07-01: qty 9, 84d supply, fill #0
  Filled 2022-07-28: qty 3, 28d supply, fill #0
  Filled 2022-09-02: qty 3, 28d supply, fill #1
  Filled 2022-09-30: qty 3, 28d supply, fill #2
  Filled 2022-11-01: qty 3, 28d supply, fill #3
  Filled 2022-11-27: qty 3, 28d supply, fill #4
  Filled 2022-12-02: qty 9, 84d supply, fill #4
  Filled 2023-02-17 (×2): qty 6, 56d supply, fill #5

## 2022-07-01 MED ORDER — PANTOPRAZOLE SODIUM 40 MG PO TBEC
40.0000 mg | DELAYED_RELEASE_TABLET | Freq: Every day | ORAL | 3 refills | Status: DC
  Filled 2022-07-01: qty 90, fill #0
  Filled 2022-07-28 – 2022-09-30 (×3): qty 90, 90d supply, fill #0
  Filled 2023-03-25 – 2023-04-22 (×2): qty 90, 90d supply, fill #1

## 2022-07-01 MED ORDER — ROSUVASTATIN CALCIUM 20 MG PO TABS
20.0000 mg | ORAL_TABLET | Freq: Every day | ORAL | 3 refills | Status: DC
  Filled 2022-07-01: qty 90, fill #0
  Filled 2022-07-28 – 2022-09-02 (×2): qty 90, 90d supply, fill #0
  Filled 2022-12-26: qty 90, 90d supply, fill #1
  Filled 2023-03-25 – 2023-04-22 (×2): qty 90, 90d supply, fill #2

## 2022-07-01 NOTE — Progress Notes (Signed)
Manuel Morrow, [name] Phone: [phone]  [name] is a 57 y.o. male who presents today for transfer of care.  DIABETES Disease Monitoring: Blood Sugar ranges- Not checking Polyuria/phagia/dipsia- No      Optho- No Medications: Compliance- Ozempic 2 mg Hypoglycemic symptoms- No Lab Results  Component Value Date   HGBA1C 5.8 04/29/2022    HYPERLIPIDEMIA Symptoms Chest pain on exertion:  No   Leg claudication:   No Medications: Compliance- Crestor 20 mg Right upper quadrant pain- No  Muscle aches- No Lab Results  Component Value Date   CHOL 96 04/29/2022   HDL 35.70 (L) 04/29/2022   LDLCALC 46 04/29/2022   TRIG 71.0 04/29/2022   CHOLHDL 3 04/29/2022   COPD: Medication compliance- Not on any medications  Rescue inhaler use- None Dyspnea- No  Wheezing- No  Cough- No  Productive- No  GERD:   Reflux symptoms: None when on medication   Abd pain: No   Blood in stool: No  Dysphagia: No   EGD: No  Medication: Protonix 40 mg  Social History   Tobacco Use  Smoking Status Every Day   Packs/day: 1.00   Years: 35.00   Total pack years: 35.00   Types: Cigarettes  Smokeless Tobacco Never    Current Outpatient Medications on File Prior to Visit  Medication Sig Dispense Refill   pantoprazole (PROTONIX) 40 MG tablet TAKE 1 TABLET (40 MG TOTAL) BY MOUTH DAILY. 90 tablet 2   rosuvastatin (CRESTOR) 20 MG tablet TAKE 1 TABLET (20 MG TOTAL) BY MOUTH DAILY. 90 tablet 2   Semaglutide, 2 MG/DOSE, 8 MG/3ML SOPN Inject 2 mg as directed once a week. 9 mL 2   No current facility-administered medications on file prior to visit.    ROS see history of present illness  Objective  Physical Exam Vitals:   07/01/22 0951  BP: 134/70  Pulse: 71  Temp: 97.8 F (36.6 C)  SpO2: 97%    BP Readings from Last 3 Encounters:  07/01/22 134/70  04/29/22 120/82  02/18/22 129/85   Wt Readings from Last 3 Encounters:  07/01/22 247 lb 9.6 oz (112.3 kg)  04/29/22 259 lb 4 oz (117.6 kg)   02/18/22 264 lb 12.8 oz (120.1 kg)    Physical Exam Constitutional:      General: He is not in acute distress.    Appearance: Normal appearance.  HENT:     Head: Normocephalic.  Cardiovascular:     Rate and Rhythm: Normal rate and regular rhythm.     Heart sounds: Normal heart sounds.  Pulmonary:     Effort: Pulmonary effort is normal.     Breath sounds: Normal breath sounds.  Skin:    General: Skin is warm and dry.  Neurological:     General: No focal deficit present.     Mental Status: He is alert.  Psychiatric:        Mood and Affect: Mood normal.        Behavior: Behavior normal.    Assessment/Plan: Please see individual problem list.  Type 2 diabetes mellitus without complication, without long-term current use of insulin (HCC) Assessment & Plan: Chronic. Stable on Ozempic 66m weekly. Continue. Refills sent. Last A1c- 5.8. Encouraged to continue health diet and exercise.    Pulmonary emphysema, unspecified emphysema type ([facility] Assessment & Plan: Patient with significant smoking history, continues to smoke approx. 1 pack per day. Recent CT Chest Lung Cancer Screening showed mild emphysema and COPD. Patient denies shortness of breath, cough  and wheezing. Lungs clear today on exam. He does not use any inhalers. Will monitor.    Hyperlipidemia, unspecified hyperlipidemia type Assessment & Plan: Chronic. Stable on Crestor 20 mg daily. Continue. Refills sent. LDL at goal- 46. Encouraged to continue healthy diet and exercise.    Gastroesophageal reflux disease, unspecified whether esophagitis present Assessment & Plan: Chronic. Stable on Protonix 40 mg daily. Continue. Refills sent. Patient asymptomatic on medication.   Aortic atherosclerosis (HCC) Assessment & Plan: Continue risk factor management and Crestor 20 mg daily. LDL- 46. Denies chest pain. Will monitor.     Return in about 16 weeks (around 10/21/2022) for Annual Exam.   Manuel Morrow, [name] [place]

## 2022-07-01 NOTE — Assessment & Plan Note (Addendum)
Chronic. Stable on Crestor 20 mg daily. Continue. Refills sent. LDL at goal- 46. Encouraged to continue healthy diet and exercise.

## 2022-07-01 NOTE — Assessment & Plan Note (Addendum)
Continue risk factor management and Crestor 20 mg daily. LDL- 46. Denies chest pain. Will monitor.

## 2022-07-01 NOTE — Assessment & Plan Note (Addendum)
Chronic. Stable on Ozempic 50m weekly. Continue. Refills sent. Last A1c- 5.8. Encouraged to continue healthy diet and exercise.

## 2022-07-01 NOTE — Telephone Encounter (Signed)
Error

## 2022-07-01 NOTE — Assessment & Plan Note (Addendum)
Chronic. Stable on Protonix 40 mg daily. Continue. Refills sent. Patient asymptomatic on medication.

## 2022-07-01 NOTE — Assessment & Plan Note (Signed)
Patient with significant smoking history, continues to smoke approx. 1 pack per day. Recent CT Chest Lung Cancer Screening showed mild emphysema and COPD. Patient denies shortness of breath, cough and wheezing. Lungs clear today on exam. He does not use any inhalers. Will monitor.

## 2022-07-19 ENCOUNTER — Encounter: Payer: Self-pay | Admitting: Nurse Practitioner

## 2022-07-28 ENCOUNTER — Telehealth: Payer: Self-pay

## 2022-07-28 ENCOUNTER — Other Ambulatory Visit: Payer: Self-pay

## 2022-07-28 NOTE — Progress Notes (Signed)
  Tomasita Morrow, NP-C Phone: [phone]  [name] is a 57 y.o. male who presents today for back pain.   Patient has a history of chronic low back pain. He has had fusions in the past due to DDD. He reports feeling okay today. He mostly feels stiff in his lower back. He reports worsening pain with certain movements such as bending and twisting. He reports decreased pain when at rest. Denies numbness and tingling in his legs. Denies weakness. He recently started taking Meloxicam that he found at home from the past and reports it has greatly helped with his back pain. He is requesting a new prescription for Meloxicam.   Social History   Tobacco Use  Smoking Status Every Day   Packs/day: 1.00   Years: 35.00   Total pack years: 35.00   Types: Cigarettes  Smokeless Tobacco Never    Current Outpatient Medications on File Prior to Visit  Medication Sig Dispense Refill   pantoprazole (PROTONIX) 40 MG tablet Take 1 tablet (40 mg total) by mouth daily. 90 tablet 3   rosuvastatin (CRESTOR) 20 MG tablet Take 1 tablet (20 mg total) by mouth daily. 90 tablet 3   Semaglutide, 2 MG/DOSE, 8 MG/3ML SOPN Inject 2 mg under the sking as directed once a week. 9 mL 2   No current facility-administered medications on file prior to visit.   ROS see history of present illness  Objective  Physical Exam Vitals:   07/29/22 0832  BP: 134/80  Pulse: 64  Temp: 98.2 F (36.8 C)  SpO2: 98%    BP Readings from Last 3 Encounters:  07/29/22 134/80  07/01/22 134/70  04/29/22 120/82   Wt Readings from Last 3 Encounters:  07/29/22 245 lb 9.6 oz (111.4 kg)  07/01/22 247 lb 9.6 oz (112.3 kg)  04/29/22 259 lb 4 oz (117.6 kg)    Physical Exam Constitutional:      General: He is not in acute distress.    Appearance: Normal appearance.  HENT:     Head: Normocephalic.  Cardiovascular:     Rate and Rhythm: Normal rate and regular rhythm.     Heart sounds: Normal heart sounds.  Pulmonary:      Effort: Pulmonary effort is normal.     Breath sounds: Normal breath sounds.  Musculoskeletal:     Lumbar back: Tenderness (midline lumbar) present. No swelling. Decreased range of motion (pain with bending and twisting).     Comments: Midline lumbar scar noted from prior surgery.   Skin:    General: Skin is warm and dry.  Neurological:     General: No focal deficit present.     Mental Status: He is alert.  Psychiatric:        Mood and Affect: Mood normal.        Behavior: Behavior normal.    Assessment/Plan: Please see individual problem list.  Chronic midline low back pain without sciatica Assessment & Plan: Hx of prior back surgeries. Pain stable at this time from recent Meloxicam use. Rx for Meloxicam 15 mg daily sent. Advised patient to avoid other NSAID use. Encouraged to return if symptoms are changing or worsening, will proceed with new imaging at that time.   Orders: -     Meloxicam; Take 1 tablet (15 mg total) by mouth daily.  Dispense: 90 tablet; Refill: 1   Return if symptoms worsen or fail to improve.   Tomasita Morrow, NP-C [place]

## 2022-07-28 NOTE — Telephone Encounter (Signed)
LMOM to CB   Wanted  to go over meds before appt tomorrow and ask more about his back pain.

## 2022-07-29 ENCOUNTER — Ambulatory Visit (INDEPENDENT_AMBULATORY_CARE_PROVIDER_SITE_OTHER): Payer: 59 | Admitting: Nurse Practitioner

## 2022-07-29 ENCOUNTER — Encounter: Payer: Self-pay | Admitting: Nurse Practitioner

## 2022-07-29 ENCOUNTER — Other Ambulatory Visit: Payer: Self-pay

## 2022-07-29 VITALS — BP 134/80 | HR 64 | Temp 98.2°F | Ht 70.47 in | Wt 245.6 lb

## 2022-07-29 DIAGNOSIS — G8929 Other chronic pain: Secondary | ICD-10-CM | POA: Diagnosis not present

## 2022-07-29 DIAGNOSIS — M545 Low back pain, unspecified: Secondary | ICD-10-CM

## 2022-07-29 MED ORDER — MELOXICAM 15 MG PO TABS
15.0000 mg | ORAL_TABLET | Freq: Every day | ORAL | 1 refills | Status: DC
  Filled 2022-07-29: qty 90, 90d supply, fill #0
  Filled 2022-11-01: qty 90, 90d supply, fill #1

## 2022-07-29 NOTE — Assessment & Plan Note (Signed)
Hx of prior back surgeries. Pain stable at this time from recent Meloxicam use. Rx for Meloxicam 15 mg daily sent. Advised patient to avoid other NSAID use. Encouraged to return if symptoms are changing or worsening, will proceed with new imaging at that time.

## 2022-09-02 ENCOUNTER — Other Ambulatory Visit: Payer: Self-pay

## 2022-09-02 ENCOUNTER — Other Ambulatory Visit (HOSPITAL_COMMUNITY): Payer: Self-pay

## 2022-09-08 ENCOUNTER — Other Ambulatory Visit (HOSPITAL_COMMUNITY): Payer: Self-pay

## 2022-09-23 ENCOUNTER — Encounter: Payer: Self-pay | Admitting: Nurse Practitioner

## 2022-09-30 ENCOUNTER — Encounter: Payer: Self-pay | Admitting: Nurse Practitioner

## 2022-09-30 ENCOUNTER — Other Ambulatory Visit (HOSPITAL_COMMUNITY): Payer: Self-pay

## 2022-09-30 ENCOUNTER — Other Ambulatory Visit: Payer: Self-pay

## 2022-09-30 ENCOUNTER — Ambulatory Visit (INDEPENDENT_AMBULATORY_CARE_PROVIDER_SITE_OTHER): Payer: 59 | Admitting: Nurse Practitioner

## 2022-09-30 ENCOUNTER — Telehealth: Payer: Self-pay | Admitting: Nurse Practitioner

## 2022-09-30 VITALS — BP 120/82 | HR 71 | Temp 98.2°F | Ht 70.47 in | Wt 239.4 lb

## 2022-09-30 DIAGNOSIS — R221 Localized swelling, mass and lump, neck: Secondary | ICD-10-CM

## 2022-09-30 NOTE — Progress Notes (Signed)
  [name], NP-C Phone: [phone]  [name] is a 57 y.o. male who presents today for lump on upper back.  Patient reports noticing a lump at the base of his neck/upper back x 1 week ago. It is on the right side of his spine. It is painful, more so when moving his right arm or when looking down. It is an aching type of pain that radiates up into his neck. He denies numbness or tingling in his arms. It has not changed in size or shape over the past week. Denies fevers.   Social History   Tobacco Use  Smoking Status Every Day   Packs/day: 1.00   Years: 35.00   Additional pack years: 0.00   Total pack years: 35.00   Types: Cigarettes  Smokeless Tobacco Never    Current Outpatient Medications on File Prior to Visit  Medication Sig Dispense Refill   meloxicam (MOBIC) 15 MG tablet Take 1 tablet (15 mg total) by mouth daily. 90 tablet 1   pantoprazole (PROTONIX) 40 MG tablet Take 1 tablet (40 mg total) by mouth daily. 90 tablet 3   rosuvastatin (CRESTOR) 20 MG tablet Take 1 tablet (20 mg total) by mouth daily. 90 tablet 3   Semaglutide, 2 MG/DOSE, 8 MG/3ML SOPN Inject 2 mg under the sking as directed once a week. 9 mL 2   No current facility-administered medications on file prior to visit.    ROS see history of present illness  Objective  Physical Exam Vitals:   09/30/22 0837  BP: 120/82  Pulse: 71  Temp: 98.2 F (36.8 C)  SpO2: 95%    BP Readings from Last 3 Encounters:  09/30/22 120/82  07/29/22 134/80  07/01/22 134/70   Wt Readings from Last 3 Encounters:  09/30/22 239 lb 6.4 oz (108.6 kg)  07/29/22 245 lb 9.6 oz (111.4 kg)  07/01/22 247 lb 9.6 oz (112.3 kg)    Physical Exam Constitutional:      General: He is not in acute distress.    Appearance: Normal appearance.  HENT:     Head: Normocephalic.  Neck:      Comments: Nickel sized, hard, moveable mass noted on the right side of spine at the base of neck/upper back. No redness present. Tender on  palpation.  Cardiovascular:     Rate and Rhythm: Normal rate and regular rhythm.     Heart sounds: Normal heart sounds.  Pulmonary:     Effort: Pulmonary effort is normal.     Breath sounds: Normal breath sounds.  Skin:    General: Skin is warm and dry.  Neurological:     General: No focal deficit present.     Mental Status: He is alert.  Psychiatric:        Mood and Affect: Mood normal.        Behavior: Behavior normal.    Assessment/Plan: Please see individual problem list.  Palpable mass of neck Assessment & Plan: Cyst like. Tenderness present on exam. Will get [place] of palpable area of concern. Will contact patient with results. Return precautions given to patient.   Orders: -     US SOFT TISSUE HEAD & NECK (NON-THYROID); Future    Return for Annual Exam as scheduled.   [name], NP-C [facility] [employer] [facility]

## 2022-09-30 NOTE — Assessment & Plan Note (Addendum)
Cyst like. Tenderness present on exam. Will get [place] of palpable area of concern. Will contact patient with results. Return precautions given to patient.

## 2022-09-30 NOTE — Telephone Encounter (Signed)
Lft to vm to call to sch [place]. Thank you!

## 2022-10-14 ENCOUNTER — Ambulatory Visit
Admission: RE | Admit: 2022-10-14 | Discharge: 2022-10-14 | Disposition: A | Discharge status: Home / Self Care | Payer: 59 | Source: Ambulatory Visit | Attending: Nurse Practitioner | Admitting: Nurse Practitioner

## 2022-10-14 DIAGNOSIS — R221 Localized swelling, mass and lump, neck: Secondary | ICD-10-CM | POA: Insufficient documentation

## 2022-10-20 ENCOUNTER — Other Ambulatory Visit: Payer: Self-pay | Admitting: Nurse Practitioner

## 2022-10-20 DIAGNOSIS — R221 Localized swelling, mass and lump, neck: Secondary | ICD-10-CM

## 2022-10-21 ENCOUNTER — Encounter: Payer: No Typology Code available for payment source | Admitting: Family Medicine

## 2022-10-21 ENCOUNTER — Encounter: Payer: 59 | Admitting: Nurse Practitioner

## 2022-10-25 ENCOUNTER — Encounter: Payer: Self-pay | Admitting: Nurse Practitioner

## 2022-10-31 ENCOUNTER — Encounter: Payer: 59 | Admitting: Nurse Practitioner

## 2022-11-02 ENCOUNTER — Other Ambulatory Visit: Payer: Self-pay

## 2022-11-04 NOTE — Telephone Encounter (Signed)
Pt wife called stating pt has huge knot on his neck and it looks infected. Pt wife would like to be called

## 2022-11-11 ENCOUNTER — Encounter: Payer: Self-pay | Admitting: Nurse Practitioner

## 2022-11-11 ENCOUNTER — Ambulatory Visit (INDEPENDENT_AMBULATORY_CARE_PROVIDER_SITE_OTHER): Payer: 59 | Admitting: Nurse Practitioner

## 2022-11-11 ENCOUNTER — Encounter: Payer: Self-pay | Admitting: Surgery

## 2022-11-11 ENCOUNTER — Ambulatory Visit: Payer: 59 | Admitting: Surgery

## 2022-11-11 ENCOUNTER — Other Ambulatory Visit: Payer: Self-pay

## 2022-11-11 VITALS — BP 142/84 | HR 75 | Temp 98.1°F | Ht 71.0 in | Wt 234.2 lb

## 2022-11-11 VITALS — BP 126/76 | HR 73 | Temp 98.4°F | Ht 70.47 in | Wt 235.8 lb

## 2022-11-11 DIAGNOSIS — Z7985 Long-term (current) use of injectable non-insulin antidiabetic drugs: Secondary | ICD-10-CM

## 2022-11-11 DIAGNOSIS — D171 Benign lipomatous neoplasm of skin and subcutaneous tissue of trunk: Secondary | ICD-10-CM

## 2022-11-11 DIAGNOSIS — R221 Localized swelling, mass and lump, neck: Secondary | ICD-10-CM | POA: Diagnosis not present

## 2022-11-11 DIAGNOSIS — E785 Hyperlipidemia, unspecified: Secondary | ICD-10-CM

## 2022-11-11 DIAGNOSIS — Z125 Encounter for screening for malignant neoplasm of prostate: Secondary | ICD-10-CM | POA: Diagnosis not present

## 2022-11-11 DIAGNOSIS — L723 Sebaceous cyst: Secondary | ICD-10-CM

## 2022-11-11 DIAGNOSIS — L089 Local infection of the skin and subcutaneous tissue, unspecified: Secondary | ICD-10-CM | POA: Diagnosis not present

## 2022-11-11 DIAGNOSIS — E119 Type 2 diabetes mellitus without complications: Secondary | ICD-10-CM

## 2022-11-11 DIAGNOSIS — Z0000 Encounter for general adult medical examination without abnormal findings: Secondary | ICD-10-CM | POA: Diagnosis not present

## 2022-11-11 DIAGNOSIS — I700 Atherosclerosis of aorta: Secondary | ICD-10-CM

## 2022-11-11 DIAGNOSIS — J439 Emphysema, unspecified: Secondary | ICD-10-CM | POA: Diagnosis not present

## 2022-11-11 DIAGNOSIS — Z1329 Encounter for screening for other suspected endocrine disorder: Secondary | ICD-10-CM

## 2022-11-11 LAB — COMPREHENSIVE METABOLIC PANEL
ALT: 21 U/L (ref 0–53)
AST: 18 U/L (ref 0–37)
Albumin: 4.3 g/dL (ref 3.5–5.2)
Alkaline Phosphatase: 66 U/L (ref 39–117)
BUN: 17 mg/dL (ref 6–23)
CO2: 29 mEq/L (ref 19–32)
Calcium: 9.2 mg/dL (ref 8.4–10.5)
Chloride: 108 mEq/L (ref 96–112)
Creatinine, Ser: 1.01 mg/dL (ref 0.40–1.50)
GFR: 81.43 mL/min (ref >60.00)
Glucose, Bld: 104 mg/dL — ABNORMAL HIGH (ref 70–99)
Potassium: 4.4 mEq/L (ref 3.5–5.1)
Sodium: 143 mEq/L (ref 135–145)
Total Bilirubin: 0.6 mg/dL (ref 0.2–1.2)
Total Protein: 7 g/dL (ref 6.0–8.3)

## 2022-11-11 LAB — LIPID PANEL
Cholesterol: 86 mg/dL (ref 0–200)
HDL: 35 mg/dL — ABNORMAL LOW (ref >39.00)
LDL Cholesterol: 32 mg/dL (ref 0–99)
NonHDL: 51.09
Total CHOL/HDL Ratio: 2
Triglycerides: 93 mg/dL (ref 0.0–149.0)
VLDL: 18.6 mg/dL (ref 0.0–40.0)

## 2022-11-11 LAB — PSA: PSA: 0.93 ng/mL (ref 0.10–4.00)

## 2022-11-11 LAB — CBC WITH DIFFERENTIAL/PLATELET
Basophils Absolute: 0.1 10*3/uL (ref 0.0–0.1)
Basophils Relative: 1.2 % (ref 0.0–3.0)
Eosinophils Absolute: 0.2 10*3/uL (ref 0.0–0.7)
Eosinophils Relative: 3.3 % (ref 0.0–5.0)
HCT: 48.2 % (ref 39.0–52.0)
Hemoglobin: 16.5 g/dL (ref 13.0–17.0)
Lymphocytes Relative: 18.7 % (ref 12.0–46.0)
Lymphs Abs: 1.2 10*3/uL (ref 0.7–4.0)
MCHC: 34.3 g/dL (ref 30.0–36.0)
MCV: 88.4 fl (ref 78.0–100.0)
Monocytes Absolute: 0.5 10*3/uL (ref 0.1–1.0)
Monocytes Relative: 8.3 % (ref 3.0–12.0)
Neutro Abs: 4.5 10*3/uL (ref 1.4–7.7)
Neutrophils Relative %: 68.5 % (ref 43.0–77.0)
Platelets: 225 10*3/uL (ref 150.0–400.0)
RBC: 5.46 Mil/uL (ref 4.22–5.81)
RDW: 13.7 % (ref 11.5–15.5)
WBC: 6.5 10*3/uL (ref 4.0–10.5)

## 2022-11-11 LAB — HEMOGLOBIN A1C: Hgb A1c MFr Bld: 5.1 % (ref 4.6–6.5)

## 2022-11-11 LAB — TSH: TSH: 0.52 u[IU]/mL (ref 0.35–5.50)

## 2022-11-11 MED ORDER — CEPHALEXIN 500 MG PO CAPS
500.0000 mg | ORAL_CAPSULE | Freq: Two times a day (BID) | ORAL | 0 refills | Status: AC
  Filled 2022-11-11: qty 14, 7d supply, fill #0

## 2022-11-11 NOTE — Progress Notes (Signed)
11/11/2022  Reason for Visit: Mass of upper back  Requesting Provider: [name], NP  History of Present Illness: [name] is a 57 y.o. male presenting for evaluation of upper back mass.  The patient reports that he started noticing a mass on his upper back coming about a month ago and started getting bigger in size.  He had an ultrasound of this area which showed a 1.7 x 1.7 x 1.0 no defined mass.  The patient reports that after the ultrasound was done, he developed a new area of swelling in the lower neck with 2 small puncture wounds that eventually started draining purulent fluid.  He denies having any fevers or chills or chest pain or shortness of breath.  He also reports that the mass in the upper back became more swollen initially and now has decreased in size, but this area never had any drainage or erythema.  He has not had any antibiotics for either area.  Past Medical History: Past Medical History:  Diagnosis Date   Diabetes mellitus without complication (HCC)    hx- borderline- diet controlled    GERD (gastroesophageal reflux disease)    Glaucoma    "TOUCH"   Headache    Hyperlipidemia    Obesity    Pulmonary emphysema (HCC) 01/03/2020   Tobacco use      Past Surgical History: Past Surgical History:  Procedure Laterality Date   BACK SURGERY  02/2008   COLONOSCOPY     POLYPECTOMY     WISDOM TOOTH EXTRACTION      Home Medications: Prior to Admission medications   Medication Sig Start Date End Date Taking? Authorizing Provider  cephALEXin (KEFLEX) 500 MG capsule Take 1 capsule (500 mg total) by mouth 2 (two) times daily for 7 days. 11/11/22 11/18/22 Yes Sharday Michl, Elita Quick, MD  meloxicam (MOBIC) 15 MG tablet Take 1 tablet (15 mg total) by mouth daily. 07/29/22  Yes [name], NP  pantoprazole (PROTONIX) 40 MG tablet Take 1 tablet (40 mg total) by mouth daily. 07/01/22 07/01/23 Yes [name], NP  rosuvastatin (CRESTOR) 20 MG tablet Take 1 tablet (20 mg total) by mouth  daily. 07/01/22 07/01/23 Yes [name], NP  Semaglutide, 2 MG/DOSE, 8 MG/3ML SOPN Inject 2 mg under the skin as directed once a week. 07/01/22  Yes [name], NP    Allergies: No Known Allergies  Social History:  reports that he has been smoking cigarettes. He has a 35.00 pack-year smoking history. He has never used smokeless tobacco. He reports current alcohol use. He reports that he does not use drugs.   Family History: Family History  Problem Relation Age of Onset   Diabetes Mother    Prostate cancer Father    Diabetes Sister    Heart disease Sister 72       MI, pacemaker   Other Brother        died in motorcycle accident   Bladder Cancer Brother    Colon cancer Neg Hx    Colon polyps Neg Hx    Rectal cancer Neg Hx    Stomach cancer Neg Hx    Crohn's disease Neg Hx    Esophageal cancer Neg Hx    Ulcerative colitis Neg Hx     Review of Systems: Review of Systems  Constitutional:  Negative for chills and fever.  Respiratory:  Negative for shortness of breath.   Cardiovascular:  Negative for chest pain.  Gastrointestinal:  Negative for abdominal pain, nausea and vomiting.  Skin:        Mass in upper back and drainage from lower neck    Physical Exam BP (!) 142/84   Pulse 75   Temp 98.1 F (36.7 C) (Oral)   Ht 5\' 11"  (1.803 m)   Wt 234 lb 3.2 oz (106.2 kg)   SpO2 96%   BMI 32.66 kg/m  CONSTITUTIONAL: No acute distress HEENT:  Normocephalic, atraumatic, extraocular motion intact. RESPIRATORY:  Normal respiratory effort without pathologic use of accessory muscles. CARDIOVASCULAR: Regular rhythm and rate. MUSCULOSKELETAL:  Normal muscle strength and tone in all four extremities.  No peripheral edema or cyanosis. SKIN: The patient has in the lower neck a 2 cm area of fluctuance with erythema which blanches on palpation.  There is 2 puncture wounds in the center of this area which are draining purulent fluid.  I was able to express the majority of purulence from this.   Dry gauze dressing was applied.  He also has in the right upper back, a 1.5 cm mass which is soft, nontender, somewhat mobile, without any overlying erythema or skin openings.  This is more consistent potentially with a lipoma. NEUROLOGIC:  Motor and sensation is grossly normal.  Cranial nerves are grossly intact. PSYCH:  Alert and oriented to person, place and time. Affect is normal.  Laboratory Analysis: Results for orders placed or performed in visit on 11/11/22 (from the past 24 hour(s))  Lipid panel     Status: Abnormal   Collection Time: 11/11/22  8:48 AM  Result Value Ref Range   Cholesterol 86 0 - 200 mg/dL   Triglycerides 16.1 0.0 - 149.0 mg/dL   HDL 09.60 (L) >45.40 mg/dL   VLDL 98.1 0.0 - 19.1 mg/dL   LDL Cholesterol 32 0 - 99 mg/dL   Total CHOL/HDL Ratio 2    NonHDL 51.09   PSA     Status: None   Collection Time: 11/11/22  8:48 AM  Result Value Ref Range   PSA 0.93 0.10 - 4.00 ng/mL  TSH     Status: None   Collection Time: 11/11/22  8:48 AM  Result Value Ref Range   TSH 0.52 0.35 - 5.50 uIU/mL  Comprehensive metabolic panel     Status: Abnormal   Collection Time: 11/11/22  8:48 AM  Result Value Ref Range   Sodium 143 135 - 145 mEq/L   Potassium 4.4 3.5 - 5.1 mEq/L   Chloride 108 96 - 112 mEq/L   CO2 29 19 - 32 mEq/L   Glucose, Bld 104 (H) 70 - 99 mg/dL   BUN 17 6 - 23 mg/dL   Creatinine, Ser 4.78 0.40 - 1.50 mg/dL   Total Bilirubin 0.6 0.2 - 1.2 mg/dL   Alkaline Phosphatase 66 39 - 117 U/L   AST 18 0 - 37 U/L   ALT 21 0 - 53 U/L   Total Protein 7.0 6.0 - 8.3 g/dL   Albumin 4.3 3.5 - 5.2 g/dL   GFR 29.56 >21.30 mL/min   Calcium 9.2 8.4 - 10.5 mg/dL  CBC with Differential/Platelet     Status: None   Collection Time: 11/11/22  8:48 AM  Result Value Ref Range   WBC 6.5 4.0 - 10.5 K/uL   RBC 5.46 4.22 - 5.81 Mil/uL   Hemoglobin 16.5 13.0 - 17.0 g/dL   HCT 86.5 78.4 - 69.6 %   MCV 88.4 78.0 - 100.0 fl   MCHC 34.3 30.0 - 36.0 g/dL   RDW 29.5 28.4 - 13.2 %  Platelets 225.0 150.0 - 400.0 K/uL   Neutrophils Relative % 68.5 43.0 - 77.0 %   Lymphocytes Relative 18.7 12.0 - 46.0 %   Monocytes Relative 8.3 3.0 - 12.0 %   Eosinophils Relative 3.3 0.0 - 5.0 %   Basophils Relative 1.2 0.0 - 3.0 %   Neutro Abs 4.5 1.4 - 7.7 K/uL   Lymphs Abs 1.2 0.7 - 4.0 K/uL   Monocytes Absolute 0.5 0.1 - 1.0 K/uL   Eosinophils Absolute 0.2 0.0 - 0.7 K/uL   Basophils Absolute 0.1 0.0 - 0.1 K/uL  Hemoglobin A1c     Status: None   Collection Time: 11/11/22  8:48 AM  Result Value Ref Range   Hgb A1c MFr Bld 5.1 4.6 - 6.5 %    Imaging: Ultrasound soft tissue neck on 10/14/2022: IMPRESSION: Patient's palpable area of concern involving the right lower neck/upper back correlates with an ill-defined isoechoic subcutaneous nodule measuring 1.7 cm, nonspecific though potentially representative of a sebaceous cyst, a complex lipoma or a foreign body reaction. Clinical correlation is advised. While of uncertain and potentially doubtful clinical significance, further evaluation with contrast-enhanced neck CT could be performed as indicated.  Assessment and Plan: This is a 57 y.o. male with an infected sebaceous cyst of the lower neck and likely a lipoma of the right upper back.  - Discussed with patient the findings on exam and the ultrasound imaging.  On exam, he definitely has an infected sebaceous cyst in the lower neck which appears to be fairly new based on the timing and history from the patient and his wife.  Currently I was able to express majority of purulence from this area and apply a dry gauze dressing.  Discussed with the patient that we can try to treat this only with oral antibiotic for now given that a lot of the purulence is out and recommended also doing warm compresses at home in order to promote further drainage.  They are in agreement.  Will prescribe Keflex 500 mg twice daily for 7-day course. - The patient also appears to have a right upper back mass  consistent with lipoma.  The patient reports that there is an area became more swollen earlier and now is smaller in size.  Although this does not fit the characteristics of a lipoma, it does not feel like a cyst at this point.  Nonetheless, we can also excise this mass. - Discussed with patient that once the sebaceous cyst has healed, we could bring him back as an office procedure to excise both areas at the same time.  He is in agreement.  Will set up a procedure visit in 3 weeks for this procedure.  All of his questions have been answered.  Return precautions given.  I spent 30 minutes dedicated to the care of this patient on the date of this encounter to include pre-visit review of records, face-to-face time with the patient discussing diagnosis and management, and any post-visit coordination of care.   Howie Ill, MD [facility]

## 2022-11-11 NOTE — Progress Notes (Unsigned)
[name], NP-C Phone: [phone]  [name] is a 57 y.o. male who presents today for annual exam. He is scheduled to see General Surgery this afternoon regarding multiple subcutaneous nodules on the back of his neck. He had an ultrasound that was nonspecific though potentially a sebaceous cyst or complex lipoma. He reports a new nodule that has appeared in the last two weeks that is very red, warm and draining fluid.   Diet: Fair- trying to improve. Eating smaller portions, increased protein and vegetables. Decreased soda and sweets Exercise: Physical job Colonoscopy: 02/18/2022- 5 year recall Prostate cancer screening: 10/15/2021 Family history-  Prostate cancer: Yes, father  Colon cancer: No Sexually active: Yes Vaccines-   Flu: UTD  Tetanus: 08/18/2017  Shingles: Completed  COVID19: x 3 HIV screening: Negative Hep C Screening: Negative Tobacco use: Yes, 1 pack per day. He is not interested in quitting  Alcohol use: No Illicit Drug use: No Dentist: Yes Ophthalmology: Yes  Social History   Tobacco Use  Smoking Status Every Day   Packs/day: 1.00   Years: 35.00   Additional pack years: 0.00   Total pack years: 35.00   Types: Cigarettes  Smokeless Tobacco Never    Current Outpatient Medications on File Prior to Visit  Medication Sig Dispense Refill   meloxicam (MOBIC) 15 MG tablet Take 1 tablet (15 mg total) by mouth daily. 90 tablet 1   pantoprazole (PROTONIX) 40 MG tablet Take 1 tablet (40 mg total) by mouth daily. 90 tablet 3   rosuvastatin (CRESTOR) 20 MG tablet Take 1 tablet (20 mg total) by mouth daily. 90 tablet 3   Semaglutide, 2 MG/DOSE, 8 MG/3ML SOPN Inject 2 mg under the skin as directed once a week. 9 mL 2   No current facility-administered medications on file prior to visit.    ROS see history of present illness  Objective  Physical Exam Vitals:   11/11/22 0831  BP: 126/76  Pulse: 73  Temp: 98.4 F (36.9 C)  SpO2: 98%    BP  Readings from Last 3 Encounters:  11/11/22 (!) 142/84  11/11/22 126/76  09/30/22 120/82   Wt Readings from Last 3 Encounters:  11/11/22 234 lb 3.2 oz (106.2 kg)  11/11/22 235 lb 12.8 oz (107 kg)  09/30/22 239 lb 6.4 oz (108.6 kg)    Physical Exam Constitutional:      General: He is not in acute distress.    Appearance: Normal appearance.  HENT:     Head: Normocephalic.     Right Ear: Tympanic membrane normal.     Left Ear: Tympanic membrane normal.     Nose: Nose normal.     Mouth/Throat:     Mouth: Mucous membranes are moist.     Pharynx: Oropharynx is clear.  Eyes:     Conjunctiva/sclera: Conjunctivae normal.     Pupils: Pupils are equal, round, and reactive to light.  Neck:     Thyroid: No thyromegaly.  Cardiovascular:     Rate and Rhythm: Normal rate and regular rhythm.     Heart sounds: Normal heart sounds.  Pulmonary:     Effort: Pulmonary effort is normal.     Breath sounds: Normal breath sounds.  Abdominal:     General: Abdomen is flat. Bowel sounds are normal.     Palpations: Abdomen is soft. There is no mass.     Tenderness: There is no abdominal tenderness.  Musculoskeletal:        General: Normal range of  motion.  Lymphadenopathy:     Cervical: No cervical adenopathy.  Skin:    General: Skin is warm and dry.     Findings: Lesion (posterior neck) present. No rash.  Neurological:     General: No focal deficit present.     Mental Status: He is alert.  Psychiatric:        Mood and Affect: Mood normal.        Behavior: Behavior normal.    Assessment/Plan: Please see individual problem list.  Preventative health care Assessment & Plan: Physical exam complete. Lab work as outlined. Will contact patient with results. Colonoscopy- UTD. PSA- today in lab work. Flu vaccine- UTD. Tetanus vaccine- UTD. Shingles vaccine- series completed. Declined additional COVID vaccines. HIV/Hep C screenings negative. Counseled on tobacco cessation, he is not interested in  quitting at this time. Recommended follow ups with Dentist and Ophthalmology for annual exams. Encouraged to continue working on healthy diet and exercising. Return to care in six months, sooner PRN.    Palpable mass of neck Assessment & Plan: Follow up with General Surgery for further evaluation as scheduled.    Type 2 diabetes mellitus without complication, without long-term current use of insulin (HCC) Assessment & Plan: Chronic. Stable on Ozempic 2mg  weekly. Continue. Last A1c- 5.8. Will check A1c today. Encouraged to continue healthy diet and exercise.   Orders: -     Hemoglobin A1c  Hyperlipidemia, unspecified hyperlipidemia type Assessment & Plan: Chronic. Stable on Crestor 20 mg daily. Continue. Will check fasting lipids today.   Orders: -     Lipid panel  Pulmonary emphysema, unspecified emphysema type [facility]) Assessment & Plan: Patient continues to smoke approx. 1 pack per day. He is not interested in quitting. Counseling provided. Asymptomatic. Not using any inhalers. Will continue to monitor. Advised to contact if symptoms develop. CT Chest Lung Cancer Screening due in August.    Aortic atherosclerosis [facility]) Assessment & Plan: Continue risk factor management and Crestor 20 mg daily. Counseled on tobacco cessation, he is not interested in quitting at this time. Lab work as outlined. Will continue to monitor.   Orders: -     Comprehensive metabolic panel -     CBC with Differential/Platelet  Thyroid disorder screen -     TSH  Screening PSA (prostate specific antigen) -     PSA   Return in about 6 months (around 05/13/2023) for Follow up.   [name], NP-C [facility] [employer]

## 2022-11-11 NOTE — Patient Instructions (Addendum)
Please pick up your prescription at your pharmacy.  If you have any concerns or questions, please feel free to call our office. See follow up appointment.  Epidermoid Cyst  An epidermoid cyst, also called an epidermal cyst, is a small lump under your skin. The cyst contains a substance called keratin. Do not try to pop or open the cyst yourself. What are the causes? A blocked hair follicle. A hair that curls and re-enters the skin instead of growing straight out of the skin. A blocked pore. Irritated skin. An injury to the skin. Certain conditions that are passed along from parent to child. Human papillomavirus (HPV). This happens rarely when cysts occur on the bottom of the feet. Long-term sun damage to the skin. What increases the risk? Having acne. Being male. Having an injury to the skin. Being past puberty. Having certain conditions caused by genes (genetic disorder) What are the signs or symptoms? These cysts are usually harmless, but they can get infected. Symptoms of infection may include: Redness. Inflammation. Tenderness. Warmth. Fever. A bad-smelling substance that drains from the cyst. Pus that drains from the cyst. How is this treated? In many cases, epidermoid cysts go away on their own without treatment. If a cyst becomes infected, treatment may include: Opening and draining the cyst, done by a doctor. After draining, you may need minor surgery to remove the rest of the cyst. Antibiotic medicine. Shots of medicines (steroids) that help to reduce inflammation. Surgery to remove the cyst. Surgery may be done if the cyst: Becomes large. Bothers you. Has a chance of turning into cancer. Do not try to open a cyst yourself. Follow these instructions at home: Medicines Take over-the-counter and prescription medicines as told by your doctor. If you were prescribed an antibiotic medicine, take it as told by your doctor. Do not stop taking it even if you start to feel  better. General instructions Keep the area around your cyst clean and dry. Wear loose, dry clothing. Avoid touching your cyst. Check your cyst every day for signs of infection. Check for: Redness, swelling, or pain. Fluid or blood. Warmth. Pus or a bad smell. Keep all follow-up visits. How is this prevented? Wear clean, dry, clothing. Avoid wearing tight clothing. Keep your skin clean and dry. Take showers or baths every day. Contact a doctor if: Your cyst has symptoms of infection. Your condition does not improve or gets worse. You have a cyst that looks different from other cysts you have had. You have a fever. Get help right away if: Redness spreads from the cyst into the area close by. Summary An epidermoid cyst is a small lump under your skin. If a cyst becomes infected, treatment may include surgery to open and drain the cyst, or to remove it. Take over-the-counter and prescription medicines only as told by your doctor. Contact a doctor if your condition is not improving or is getting worse. Keep all follow-up visits. This information is not intended to replace advice given to you by your health care provider. Make sure you discuss any questions you have with your health care provider. Document Revised: 08/14/2019 Document Reviewed: 08/14/2019 Elsevier Patient Education  2024 [employer].

## 2022-11-16 ENCOUNTER — Encounter: Payer: Self-pay | Admitting: Nurse Practitioner

## 2022-11-16 NOTE — Assessment & Plan Note (Addendum)
Follow up with General Surgery for further evaluation as scheduled.

## 2022-11-16 NOTE — Assessment & Plan Note (Signed)
Physical exam complete. Lab work as outlined. Will contact patient with results. Colonoscopy- UTD. PSA- today in lab work. Flu vaccine- UTD. Tetanus vaccine- UTD. Shingles vaccine- series completed. Declined additional COVID vaccines. HIV/Hep C screenings negative. Counseled on tobacco cessation, he is not interested in quitting at this time. Recommended follow ups with Dentist and Ophthalmology for annual exams. Encouraged to continue working on healthy diet and exercising. Return to care in six months, sooner PRN.

## 2022-11-16 NOTE — Assessment & Plan Note (Signed)
Chronic. Stable on Ozempic 2mg  weekly. Continue. Last A1c- 5.8. Will check A1c today. Encouraged to continue healthy diet and exercise.

## 2022-11-16 NOTE — Assessment & Plan Note (Signed)
Patient continues to smoke approx. 1 pack per day. He is not interested in quitting. Counseling provided. Asymptomatic. Not using any inhalers. Will continue to monitor. Advised to contact if symptoms develop. CT Chest Lung Cancer Screening due in August.

## 2022-11-16 NOTE — Assessment & Plan Note (Signed)
Continue risk factor management and Crestor 20 mg daily. Counseled on tobacco cessation, he is not interested in quitting at this time. Lab work as outlined. Will continue to monitor.

## 2022-11-16 NOTE — Assessment & Plan Note (Signed)
Chronic. Stable on Crestor 20 mg daily. Continue. Will check fasting lipids today.

## 2022-11-27 ENCOUNTER — Other Ambulatory Visit: Payer: Self-pay

## 2022-12-02 ENCOUNTER — Ambulatory Visit: Payer: 59 | Admitting: Surgery

## 2022-12-02 ENCOUNTER — Encounter: Payer: Self-pay | Admitting: Surgery

## 2022-12-02 ENCOUNTER — Other Ambulatory Visit: Payer: Self-pay

## 2022-12-02 VITALS — BP 116/79 | HR 87 | Temp 97.7°F | Ht 71.0 in | Wt 233.0 lb

## 2022-12-02 DIAGNOSIS — L723 Sebaceous cyst: Secondary | ICD-10-CM

## 2022-12-02 DIAGNOSIS — D171 Benign lipomatous neoplasm of skin and subcutaneous tissue of trunk: Secondary | ICD-10-CM

## 2022-12-02 DIAGNOSIS — L089 Local infection of the skin and subcutaneous tissue, unspecified: Secondary | ICD-10-CM

## 2022-12-02 NOTE — Patient Instructions (Addendum)
If you have any concerns or questions, please feel free to call our office. See follow up appointment.  Epidermoid Cyst Removal, Care After This sheet gives you information about how to care for yourself after your procedure. Your health care provider may also give you more specific instructions. If you have problems or questions, contact your health care provider. What can I expect after the procedure? After the procedure, it is common to have: Soreness in the area where your cyst was removed. Tightness or itchiness from the stitches (sutures) in your skin. Follow these instructions at home: Medicines Take over-the-counter and prescription medicines only as told by your health care provider. If you were prescribed an antibiotic medicine or ointment, take or apply it as told by your health care provider. Do not stop using the antibiotic even if you start to feel better. Incision care  Follow instructions from your health care provider about how to take care of your incision. Make sure you: Wash your hands with soap and water for at least 20 seconds before you change your bandage (dressing). If soap and water are not available, use hand sanitizer. Change your dressing as told by your health care provider. Leave sutures, skin glue, or adhesive strips in place. These skin closures may need to stay in place for 1-2 weeks or longer. If adhesive strip edges start to loosen and curl up, you may trim the loose edges. Do not remove adhesive strips completely unless your health care provider tells you to do that. Keep the dressing dry until your health care provider says that it can be removed. After your dressing is off, check your incision area every day for signs of infection. Check for: Redness, swelling, or pain. Fluid or blood. Warmth. Pus or a bad smell. General instructions Do not take baths, swim, or use a hot tub until your health care provider approves. Ask your health care provider if you  may take showers. You may only be allowed to take sponge baths. Your health care provider may ask you to avoid contact sports or activities that take a lot of effort. Do not do anything that stretches or puts pressure on your incision. You can return to your normal diet. Keep all follow-up visits. This is important. Contact a health care provider if: You have a fever. You have redness, swelling, or pain in the incision area. You have fluid or blood coming from your incision. You have pus or a bad smell coming from your incision. Your incision feels warm to the touch. Your cyst grows back. Get help right away if: If the incision site suddenly increases in size and you have pain at the incision site. You may be checked for a collection of blood under the skin from the procedure (hematoma). Summary After the procedure, it is common to have soreness in the area where your cyst was removed. Take or apply over-the-counter and prescription medicines only as told by your health care provider. Follow instructions from your health care provider about how to take care of your incision. This information is not intended to replace advice given to you by your health care provider. Make sure you discuss any questions you have with your health care provider. Lipoma Removal, Care After The following information offers guidance on how to care for yourself after your procedure. Your health care provider may also give you more specific instructions. If you have problems or questions, contact your health care provider. What can I expect after the procedure?  After the procedure, it is common to have: Mild pain. Swelling. Bruising. Follow these instructions at home: Bathing  Do not take baths, swim, or use a hot tub until your health care provider approves. Ask your health care provider if you may take showers. You may only be allowed to take sponge baths. Keep your bandage (dressing) clean and dry until your  health care provider says it can be removed. Incision care  Follow instructions from your health care provider about how to take care of your incision. Make sure you: Wash your hands with soap and water for at least 20 seconds before and after you change your dressing. If soap and water are not available, use hand sanitizer. Change your dressing as told by your health care provider. Leave stitches (sutures), skin glue, or adhesive strips in place. These skin closures may need to stay in place for 2 weeks or longer. If adhesive strip edges start to loosen and curl up, you may trim the loose edges. Do not remove adhesive strips completely unless your health care provider tells you to do that. Check your incision area every day for signs of infection. Check for: More redness, swelling, or pain. Fluid or blood. Warmth. Pus or a bad smell. Medicines Take over-the-counter and prescription medicines only as told by your health care provider. If you were prescribed an antibiotic medicine, use it as told by your health care provider. Do not stop using the antibiotic even if you start to feel better. General instructions  If you were given a sedative during the procedure, it can affect you for several hours. Do not drive or operate machinery until your health care provider says that it is safe. Do not use any products that contain nicotine or tobacco before the procedure. These products include cigarettes, chewing tobacco, and vaping devices, such as e-cigarettes. These can delay healing after surgery. If you need help quitting, ask your health care provider. Return to your normal activities as told by your health care provider. Ask your health care provider what activities are safe for you. Keep all follow-up visits. This is important. Contact a health care provider if: You have more redness, swelling, or pain around your incision. You have fluid or blood coming from your incision. Your incision feels  warm to the touch. You have pus or a bad smell coming from your incision. You have pain that does not get better with medicine. Get help right away if: You have chills or a fever. You have severe pain. Summary After the procedure, it is common to have mild pain, swelling, and bruising. Follow instructions from your health care provider about how to take care of your incision. Contact a health care provider if you have signs of infection such as more redness, swelling, or pain. This information is not intended to replace advice given to you by your health care provider. Make sure you discuss any questions you have with your health care provider. Document Revised: 05/28/2021 Document Reviewed: 05/28/2021 Elsevier Patient Education  2024 Elsevier Inc.   \

## 2022-12-02 NOTE — Progress Notes (Signed)
  Procedure Date:  12/02/2022  Pre-operative Diagnosis:  Previously infected sebaceous cyst of the lower neck; lipoma of right upper back  Post-operative Diagnosis:  Previously infected sebaceous cyst of the lower neck, 1.5 cm; lipoma of right upper back, 2 cm.  Procedure:  Excision of lower neck 1.5 cm sebaceous cyst with layered closure of 2.5 cm incision; excision of 2 cm right upper back lipoma  Surgeon:  Howie Ill, MD  Anesthesia:  7 ml of 1% lidocaine with epi  Estimated Blood Loss:  10 ml  Specimens:   Sebaceous cyst lower neck Lipoma right upper back  Complications:  None  Indications for Procedure:  This is a 57 y.o. male with diagnosis of a previously infected sebaceous cyst of the lower neck, treated with antibiotics.  He also has a lipoma of the right upper back.  The patient wishes to have them excised. The risks of bleeding, abscess or infection, injury to surrounding structures, and need for further procedures were all discussed with the patient and he was willing to proceed.  Description of Procedure: The patient was correctly identified at bedside.  The patient was placed supine.  Appropriate time-outs were performed.  The patient's lower neck and right upper back were prepped and draped in usual sterile fashion.  I started with the neck cyst.  Local anesthetic was infused intradermally.  A 2.5 cm elliptical incision was made over the cyst, incorporating the patient's entry pore/drainage site, and scalpel was used to dissect down the skin and subcutaneous tissue.  Skin flaps were created sharply, and then the cyst was excised intact.  It was sent off to pathology.  The cavity was then irrigated and hemostasis was assured with manual pressure.  The wound was then closed in two layers using 3-0 Vicryl and 4-0 Monocryl.  The incision was cleaned and sealed with DermaBond.  I then continued with the right upper back lipoma.  Local anesthetic was infused intradermally.   A 2.5 cm incision was made over the lipoma, and scalpel was used to dissect down the skin and subcutaneous tissue.  Skin flaps were created sharply, and then the lipoma was excised.  It was sent off to pathology.  The cavity was then irrigated and hemostasis was assured with manual pressure.  The wound was then closed in two layers using 3-0 Vicryl and 4-0 Monocryl.  The incision was cleaned and sealed with DermaBond.  The patient tolerated the procedure well and all sharps were appropriately disposed of at the end of the case.  --Patient may shower tomorrow --Activity restrictions discussed --May take Tylenol or ibuprofen for pain. --Follow up in 1 week for wound check.  Howie Ill, MD

## 2022-12-09 ENCOUNTER — Ambulatory Visit (INDEPENDENT_AMBULATORY_CARE_PROVIDER_SITE_OTHER): Payer: 59 | Admitting: Surgery

## 2022-12-09 ENCOUNTER — Encounter: Payer: Self-pay | Admitting: Surgery

## 2022-12-09 VITALS — BP 134/80 | HR 65 | Temp 98.1°F | Ht 71.0 in | Wt 233.4 lb

## 2022-12-09 DIAGNOSIS — L089 Local infection of the skin and subcutaneous tissue, unspecified: Secondary | ICD-10-CM

## 2022-12-09 DIAGNOSIS — L723 Sebaceous cyst: Secondary | ICD-10-CM

## 2022-12-09 DIAGNOSIS — D171 Benign lipomatous neoplasm of skin and subcutaneous tissue of trunk: Secondary | ICD-10-CM

## 2022-12-09 NOTE — Patient Instructions (Signed)
If you have any concerns or questions, please feel free to call our office.   Excision of Skin Lesions, Care After The following information offers guidance on how to care for yourself after your procedure. Your health care provider may also give you more specific instructions. If you have problems or questions, contact your health care provider. What can I expect after the procedure? After your procedure, it is common to have: Soreness or mild pain. Some redness and swelling. Follow these instructions at home: Excision site care  Follow instructions from your health care provider about how to take care of your excision site. Make sure you: Wash your hands with soap and water for at least 20 seconds before and after you change your bandage (dressing). If soap and water are not available, use hand sanitizer. Change your dressing as told by your health care provider. Leave stitches (sutures), skin glue, or adhesive strips in place. These skin closures may need to stay in place for 2 weeks or longer. If adhesive strip edges start to loosen and curl up, you may trim the loose edges. Do not remove adhesive strips completely unless your health care provider tells you to do that. Check the excision area every day for signs of infection. Watch for: More redness, swelling, or pain. Fluid or blood. Warmth. Pus or a bad smell. Keep the site clean, dry, and protected for at least 48 hours. For bleeding, apply gentle but firm pressure to the area using a folded towel for 20 minutes. Do not take baths, swim, or use a hot tub until your health care provider approves. Ask your health care provider if you may take showers. You may only be allowed to take sponge baths. General instructions Take over-the-counter and prescription medicines only as told by your health care provider. Follow instructions from your health care provider about how to minimize scarring. Scarring should lessen over time. Avoid sun  exposure until the area has healed. Use sunscreen to protect the area from the sun after it has healed. Avoid high-impact exercise and activities until the sutures are removed or the area heals. Keep all follow-up visits. This is important. Contact a health care provider if: You have more redness, swelling, or pain around your excision site. You have fluid or blood coming from your excision site. Your excision site feels warm to the touch. You have pus or a bad smell coming from your excision site. You have a fever. You have pain that does not improve in 2-3 days after your procedure. Get help right away if: You have bleeding that does not stop with pressure or a dressing. Your wound opens up. Summary Take over-the-counter and prescription medicines only as told by your health care provider. Change your dressing as told by your health care provider. Contact a health care provider if you have redness, swelling, pain, or other signs of infection around your excision site. Keep all follow-up visits. This is important. This information is not intended to replace advice given to you by your health care provider. Make sure you discuss any questions you have with your health care provider. Document Revised: 12/08/2020 Document Reviewed: 12/08/2020 Elsevier Patient Education  2024 [employer]vier Inc.  

## 2022-12-09 NOTE — Progress Notes (Signed)
12/09/2022  HPI: [name] is a 57 y.o. male s/p excision of lower neck cyst and right upper back lipoma on 12/02/22.  Patient presents for follow up.  He reports he's doing well.  Denies any pain and just reports some itchiness over the incisions.  No drainage, redness, or worsening pain.  Vital signs: BP 134/80   Pulse 65   Temp 98.1 F (36.7 C) (Oral)   Ht 5\' 11"  (1.803 m)   Wt 233 lb 6.4 oz (105.9 kg)   SpO2 98%   BMI 32.55 kg/m    Physical Exam: Constitutional:  No acute distress Skin:  Neck and upper back incisions are healing well, clean, dry, intact.  No evidence of infection or dehiscence.  Assessment/Plan: This is a 57 y.o. male s/p excision of lower neck cyst and right upper back lipoma.  --Reviewed pathology results showing previously infected cyst and lipoma.  No malignancy or any suspicious findings. --No evidence of complications and both incisions are healing well. --Follow up as needed.   Howie Ill, MD [facility]

## 2022-12-26 ENCOUNTER — Other Ambulatory Visit (HOSPITAL_COMMUNITY): Payer: Self-pay

## 2023-01-05 ENCOUNTER — Encounter: Payer: Self-pay | Admitting: *Deleted

## 2023-01-05 ENCOUNTER — Ambulatory Visit (HOSPITAL_BASED_OUTPATIENT_CLINIC_OR_DEPARTMENT_OTHER): Payer: 59

## 2023-02-17 ENCOUNTER — Other Ambulatory Visit (HOSPITAL_COMMUNITY): Payer: Self-pay

## 2023-02-17 ENCOUNTER — Other Ambulatory Visit: Payer: Self-pay | Admitting: Nurse Practitioner

## 2023-02-17 DIAGNOSIS — M545 Low back pain, unspecified: Secondary | ICD-10-CM

## 2023-02-20 ENCOUNTER — Other Ambulatory Visit: Payer: Self-pay

## 2023-02-20 ENCOUNTER — Other Ambulatory Visit (HOSPITAL_COMMUNITY): Payer: Self-pay

## 2023-02-20 MED ORDER — MELOXICAM 15 MG PO TABS
15.0000 mg | ORAL_TABLET | Freq: Every day | ORAL | 1 refills | Status: DC
Start: 2023-02-20 — End: 2023-05-19
  Filled 2023-02-20: qty 90, 90d supply, fill #0

## 2023-03-21 ENCOUNTER — Other Ambulatory Visit: Payer: Self-pay | Admitting: Nurse Practitioner

## 2023-03-21 DIAGNOSIS — E119 Type 2 diabetes mellitus without complications: Secondary | ICD-10-CM

## 2023-03-22 ENCOUNTER — Other Ambulatory Visit (HOSPITAL_COMMUNITY): Payer: Self-pay

## 2023-03-22 MED ORDER — OZEMPIC (2 MG/DOSE) 8 MG/3ML ~~LOC~~ SOPN
2.0000 mg | PEN_INJECTOR | SUBCUTANEOUS | 2 refills | Status: DC
  Filled 2023-03-22 – 2023-04-22 (×4): qty 9, 84d supply, fill #0
  Filled 2023-07-18: qty 9, 84d supply, fill #1
  Filled 2023-10-03: qty 9, 84d supply, fill #2

## 2023-03-23 ENCOUNTER — Other Ambulatory Visit (HOSPITAL_COMMUNITY): Payer: Self-pay

## 2023-03-26 ENCOUNTER — Other Ambulatory Visit: Payer: Self-pay

## 2023-03-27 ENCOUNTER — Other Ambulatory Visit (HOSPITAL_COMMUNITY): Payer: Self-pay

## 2023-03-27 ENCOUNTER — Other Ambulatory Visit: Payer: Self-pay

## 2023-04-06 ENCOUNTER — Other Ambulatory Visit: Payer: Self-pay

## 2023-04-19 ENCOUNTER — Other Ambulatory Visit: Payer: Self-pay

## 2023-04-22 ENCOUNTER — Other Ambulatory Visit (HOSPITAL_COMMUNITY): Payer: Self-pay

## 2023-04-24 ENCOUNTER — Other Ambulatory Visit (HOSPITAL_COMMUNITY): Payer: Self-pay

## 2023-04-24 ENCOUNTER — Other Ambulatory Visit: Payer: Self-pay

## 2023-05-19 ENCOUNTER — Other Ambulatory Visit: Payer: Self-pay

## 2023-05-19 ENCOUNTER — Encounter: Payer: Self-pay | Admitting: Nurse Practitioner

## 2023-05-19 ENCOUNTER — Ambulatory Visit (INDEPENDENT_AMBULATORY_CARE_PROVIDER_SITE_OTHER): Payer: 59 | Admitting: Nurse Practitioner

## 2023-05-19 VITALS — BP 120/68 | HR 75 | Temp 98.1°F | Ht 71.0 in | Wt 233.0 lb

## 2023-05-19 DIAGNOSIS — E119 Type 2 diabetes mellitus without complications: Secondary | ICD-10-CM | POA: Diagnosis not present

## 2023-05-19 DIAGNOSIS — E785 Hyperlipidemia, unspecified: Secondary | ICD-10-CM

## 2023-05-19 DIAGNOSIS — Z7985 Long-term (current) use of injectable non-insulin antidiabetic drugs: Secondary | ICD-10-CM | POA: Diagnosis not present

## 2023-05-19 DIAGNOSIS — M25511 Pain in right shoulder: Secondary | ICD-10-CM | POA: Diagnosis not present

## 2023-05-19 DIAGNOSIS — J439 Emphysema, unspecified: Secondary | ICD-10-CM | POA: Diagnosis not present

## 2023-05-19 DIAGNOSIS — M545 Low back pain, unspecified: Secondary | ICD-10-CM

## 2023-05-19 DIAGNOSIS — G8929 Other chronic pain: Secondary | ICD-10-CM | POA: Diagnosis not present

## 2023-05-19 DIAGNOSIS — M25512 Pain in left shoulder: Secondary | ICD-10-CM

## 2023-05-19 DIAGNOSIS — Z1329 Encounter for screening for other suspected endocrine disorder: Secondary | ICD-10-CM | POA: Diagnosis not present

## 2023-05-19 DIAGNOSIS — K219 Gastro-esophageal reflux disease without esophagitis: Secondary | ICD-10-CM

## 2023-05-19 LAB — CBC WITH DIFFERENTIAL/PLATELET
Basophils Absolute: 0.1 10*3/uL (ref 0.0–0.1)
Basophils Relative: 1 % (ref 0.0–3.0)
Eosinophils Absolute: 0.2 10*3/uL (ref 0.0–0.7)
Eosinophils Relative: 3.2 % (ref 0.0–5.0)
HCT: 51.2 % (ref 39.0–52.0)
Hemoglobin: 17.7 g/dL — ABNORMAL HIGH (ref 13.0–17.0)
Lymphocytes Relative: 20.5 % (ref 12.0–46.0)
Lymphs Abs: 1.4 10*3/uL (ref 0.7–4.0)
MCHC: 34.5 g/dL (ref 30.0–36.0)
MCV: 89.2 fL (ref 78.0–100.0)
Monocytes Absolute: 0.4 10*3/uL (ref 0.1–1.0)
Monocytes Relative: 6.6 % (ref 3.0–12.0)
Neutro Abs: 4.6 10*3/uL (ref 1.4–7.7)
Neutrophils Relative %: 68.7 % (ref 43.0–77.0)
Platelets: 256 10*3/uL (ref 150.0–400.0)
RBC: 5.73 Mil/uL (ref 4.22–5.81)
RDW: 13.8 % (ref 11.5–15.5)
WBC: 6.8 10*3/uL (ref 4.0–10.5)

## 2023-05-19 LAB — LIPID PANEL
Cholesterol: 112 mg/dL (ref 0–200)
HDL: 42 mg/dL (ref >39.00)
LDL Cholesterol: 53 mg/dL (ref 0–99)
NonHDL: 69.55
Total CHOL/HDL Ratio: 3
Triglycerides: 83 mg/dL (ref 0.0–149.0)
VLDL: 16.6 mg/dL (ref 0.0–40.0)

## 2023-05-19 LAB — COMPREHENSIVE METABOLIC PANEL
ALT: 34 U/L (ref 0–53)
AST: 23 U/L (ref 0–37)
Albumin: 4.5 g/dL (ref 3.5–5.2)
Alkaline Phosphatase: 64 U/L (ref 39–117)
BUN: 17 mg/dL (ref 6–23)
CO2: 26 meq/L (ref 19–32)
Calcium: 9.7 mg/dL (ref 8.4–10.5)
Chloride: 107 meq/L (ref 96–112)
Creatinine, Ser: 0.89 mg/dL (ref 0.40–1.50)
GFR: 93.49 mL/min (ref >60.00)
Glucose, Bld: 96 mg/dL (ref 70–99)
Potassium: 4.3 meq/L (ref 3.5–5.1)
Sodium: 142 meq/L (ref 135–145)
Total Bilirubin: 0.9 mg/dL (ref 0.2–1.2)
Total Protein: 7 g/dL (ref 6.0–8.3)

## 2023-05-19 LAB — HEMOGLOBIN A1C: Hgb A1c MFr Bld: 5.4 % (ref 4.6–6.5)

## 2023-05-19 LAB — TSH: TSH: 1.12 u[IU]/mL (ref 0.35–5.50)

## 2023-05-19 MED ORDER — CELECOXIB 100 MG PO CAPS
100.0000 mg | ORAL_CAPSULE | Freq: Two times a day (BID) | ORAL | 3 refills | Status: AC
  Filled 2023-05-19: qty 180, 90d supply, fill #0
  Filled 2023-05-22: qty 120, 60d supply, fill #0
  Filled 2023-07-18: qty 120, 60d supply, fill #1
  Filled 2023-10-03: qty 120, 60d supply, fill #2
  Filled 2023-12-03: qty 120, 60d supply, fill #3
  Filled 2024-01-29: qty 120, 60d supply, fill #4
  Filled 2024-04-03: qty 120, 60d supply, fill #5

## 2023-05-19 NOTE — Assessment & Plan Note (Signed)
He has been adhering to Ozempic 2mg  weekly, with his last A1C at 5.1. We will continue Ozempic at the same dosage and order an A1C test to monitor control.

## 2023-05-19 NOTE — Assessment & Plan Note (Addendum)
He indicates that Meloxicam is ineffective for managing his chronic back pain. We will discontinue Meloxicam and initiate Celebrex 100mg  BID, which might also help with his shoulder pain.

## 2023-05-19 NOTE — Assessment & Plan Note (Addendum)
Patient continues to smoke approx. 1 pack per day and has declined lung cancer screening. He is not interested in quitting. Counseling provided. Asymptomatic. Not using any inhalers. We will monitor for respiratory symptoms.

## 2023-05-19 NOTE — Assessment & Plan Note (Signed)
He reports bilateral shoulder pain, more severe on the right, attributed to heavy lifting and working with arms above head. His right shoulder has a history of pitching, and he experienced a significant pop in his left shoulder without subsequent numbness. We will order bilateral shoulder x-rays to identify any arthritis or structural abnormalities. Results will be communicated via MyChart, and a referral to orthopedics may follow based on these findings.

## 2023-05-19 NOTE — Assessment & Plan Note (Signed)
Cholesterol well managed on Crestor 20 mg daily. Continue.

## 2023-05-19 NOTE — Assessment & Plan Note (Signed)
He reports no reflux issues while on Protonix 40 mg daily, which will be continued.

## 2023-05-19 NOTE — Progress Notes (Signed)
[name], NP-C Phone: [phone]  [name] is a 57 y.o. male who presents today for follow up.   Discussed the use of AI scribe software for clinical note transcription with the patient, who gave verbal consent to proceed.  History of Present Illness   The patient, with a history of chronic back pain, presents with bilateral shoulder pain that has been ongoing for a couple of months. The right shoulder is reported to ache constantly, while the left shoulder occasionally pops. The patient denies any specific injury to the shoulders, but notes a history of heavy lifting and working with arms raised overhead. There is no reported numbness or tingling in the arms, but the patient recalls an instance when the left shoulder popped significantly, causing temporary numbness in the hand. Despite the discomfort, the patient maintains that strength in both arms is satisfactory.  In addition to the shoulder pain, the patient reports persistent back pain. The patient has been on meloxicam for this, which initially provided relief but seems to have lost its efficacy. The patient has not tried any other medications for the back pain.  The patient also has a history of smoking and has undergone lung cancer screening in the past. However, the patient has declined to undergo another screening at this time. The patient denies any respiratory symptoms such as coughing or shortness of breath.  The patient's other chronic conditions include diabetes, managed with Ozempic, and hyperlipidemia, managed with Crestor. The patient also takes Protonix for reflux, which appears to be well-controlled with the medication. The patient denies any abdominal pain, trouble swallowing, or blood in the stool.      Social History   Tobacco Use  Smoking Status Every Day   Current packs/day: 1.00   Average packs/day: 1 pack/day for 35.0 years (35.0 ttl pk-yrs)   Types: Cigarettes  Smokeless Tobacco Never    Current  Outpatient Medications on File Prior to Visit  Medication Sig Dispense Refill   pantoprazole (PROTONIX) 40 MG tablet Take 1 tablet (40 mg total) by mouth daily. 90 tablet 3   rosuvastatin (CRESTOR) 20 MG tablet Take 1 tablet (20 mg total) by mouth daily. 90 tablet 3   Semaglutide, 2 MG/DOSE, (OZEMPIC, 2 MG/DOSE,) 8 MG/3ML SOPN Inject 2 mg under the skin as directed once a week. 9 mL 2   No current facility-administered medications on file prior to visit.    ROS see history of present illness  Objective  Physical Exam Vitals:   05/19/23 0820  BP: 120/68  Pulse: 75  Temp: 98.1 F (36.7 C)  SpO2: 98%    BP Readings from Last 3 Encounters:  05/19/23 120/68  12/09/22 134/80  12/02/22 116/79   Wt Readings from Last 3 Encounters:  05/19/23 233 lb (105.7 kg)  12/09/22 233 lb 6.4 oz (105.9 kg)  12/02/22 233 lb (105.7 kg)    Physical Exam Constitutional:      General: He is not in acute distress.    Appearance: Normal appearance.  HENT:     Head: Normocephalic.  Cardiovascular:     Rate and Rhythm: Normal rate and regular rhythm.     Heart sounds: Normal heart sounds.  Pulmonary:     Effort: Pulmonary effort is normal.     Breath sounds: Normal breath sounds.  Musculoskeletal:     Right shoulder: Tenderness and crepitus present. Decreased range of motion. Normal strength.     Left shoulder: Tenderness and crepitus present. Decreased range of motion. Normal  strength.  Skin:    General: Skin is warm and dry.  Neurological:     General: No focal deficit present.     Mental Status: He is alert.  Psychiatric:        Mood and Affect: Mood normal.        Behavior: Behavior normal.    Assessment/Plan: Please see individual problem list.  Chronic pain of both shoulders Assessment & Plan: He reports bilateral shoulder pain, more severe on the right, attributed to heavy lifting and working with arms above head. His right shoulder has a history of pitching, and he experienced  a significant pop in his left shoulder without subsequent numbness. We will order bilateral shoulder x-rays to identify any arthritis or structural abnormalities. Results will be communicated via MyChart, and a referral to orthopedics may follow based on these findings.  Orders: -     DG Shoulder Left; Future -     DG Shoulder Right; Future -     Celecoxib; Take 1 capsule (100 mg total) by mouth 2 (two) times daily.  Dispense: 180 capsule; Refill: 3  Type 2 diabetes mellitus without complication, without long-term current use of insulin (HCC) Assessment & Plan: He has been adhering to Ozempic 2mg  weekly, with his last A1C at 5.1. We will continue Ozempic at the same dosage and order an A1C test to monitor control.  Orders: -     Comprehensive metabolic panel -     Hemoglobin A1c  Chronic midline low back pain without sciatica Assessment & Plan: He indicates that Meloxicam is ineffective for managing his chronic back pain. We will discontinue Meloxicam and initiate Celebrex 100mg  BID, which might also help with his shoulder pain.  Orders: -     Celecoxib; Take 1 capsule (100 mg total) by mouth 2 (two) times daily.  Dispense: 180 capsule; Refill: 3  Hyperlipidemia, unspecified hyperlipidemia type Assessment & Plan: Cholesterol well managed on Crestor 20 mg daily. Continue.    Orders: -     Lipid panel  Gastroesophageal reflux disease, unspecified whether esophagitis present Assessment & Plan: He reports no reflux issues while on Protonix 40 mg daily, which will be continued.   Pulmonary emphysema, unspecified emphysema type [facility]) Assessment & Plan: Patient continues to smoke approx. 1 pack per day and has declined lung cancer screening. He is not interested in quitting. Counseling provided. Asymptomatic. Not using any inhalers. We will monitor for respiratory symptoms.  Orders: -     CBC with Differential/Platelet  Thyroid disorder screen -     TSH    Return in about 6  months (around 11/17/2023) for Follow up.   [name], NP-C [facility] - [facility]

## 2023-05-22 ENCOUNTER — Other Ambulatory Visit (HOSPITAL_COMMUNITY): Payer: Self-pay

## 2023-05-22 ENCOUNTER — Other Ambulatory Visit: Payer: Self-pay

## 2023-05-22 ENCOUNTER — Telehealth: Payer: Self-pay

## 2023-05-22 NOTE — Telephone Encounter (Signed)
2nd attempt to contact pt did not leave a 2nd vm

## 2023-05-22 NOTE — Telephone Encounter (Signed)
 VM left to CB in regards to labs

## 2023-05-23 NOTE — Telephone Encounter (Signed)
3rd attempt to contact pt in regards to labs. Mychart message has been sent as well as a letter mailed.

## 2023-06-16 ENCOUNTER — Ambulatory Visit: Payer: Commercial Managed Care - PPO

## 2023-06-16 ENCOUNTER — Ambulatory Visit: Payer: Commercial Managed Care - PPO | Admitting: Nurse Practitioner

## 2023-06-16 ENCOUNTER — Other Ambulatory Visit (INDEPENDENT_AMBULATORY_CARE_PROVIDER_SITE_OTHER): Payer: Commercial Managed Care - PPO

## 2023-06-16 ENCOUNTER — Encounter: Payer: Self-pay | Admitting: Nurse Practitioner

## 2023-06-16 ENCOUNTER — Other Ambulatory Visit: Payer: Self-pay | Admitting: Nurse Practitioner

## 2023-06-16 DIAGNOSIS — G8929 Other chronic pain: Secondary | ICD-10-CM

## 2023-06-16 DIAGNOSIS — M25511 Pain in right shoulder: Secondary | ICD-10-CM

## 2023-06-16 DIAGNOSIS — M25512 Pain in left shoulder: Secondary | ICD-10-CM

## 2023-06-16 DIAGNOSIS — M19011 Primary osteoarthritis, right shoulder: Secondary | ICD-10-CM | POA: Diagnosis not present

## 2023-06-16 DIAGNOSIS — D582 Other hemoglobinopathies: Secondary | ICD-10-CM

## 2023-06-16 LAB — CBC WITH DIFFERENTIAL/PLATELET
Basophils Absolute: 0.1 10*3/uL (ref 0.0–0.1)
Basophils Relative: 1.1 % (ref 0.0–3.0)
Eosinophils Absolute: 0.3 10*3/uL (ref 0.0–0.7)
Eosinophils Relative: 3.4 % (ref 0.0–5.0)
HCT: 49.4 % (ref 39.0–52.0)
Hemoglobin: 17 g/dL (ref 13.0–17.0)
Lymphocytes Relative: 18.3 % (ref 12.0–46.0)
Lymphs Abs: 1.4 10*3/uL (ref 0.7–4.0)
MCHC: 34.5 g/dL (ref 30.0–36.0)
MCV: 88.9 fL (ref 78.0–100.0)
Monocytes Absolute: 0.7 10*3/uL (ref 0.1–1.0)
Monocytes Relative: 9.3 % (ref 3.0–12.0)
Neutro Abs: 5.1 10*3/uL (ref 1.4–7.7)
Neutrophils Relative %: 67.9 % (ref 43.0–77.0)
Platelets: 240 10*3/uL (ref 150.0–400.0)
RBC: 5.56 Mil/uL (ref 4.22–5.81)
RDW: 13.4 % (ref 11.5–15.5)
WBC: 7.6 10*3/uL (ref 4.0–10.5)

## 2023-06-19 ENCOUNTER — Other Ambulatory Visit: Payer: Self-pay

## 2023-06-19 ENCOUNTER — Ambulatory Visit: Payer: Self-pay | Admitting: Nurse Practitioner

## 2023-06-19 ENCOUNTER — Encounter: Payer: Self-pay | Admitting: Family Medicine

## 2023-06-19 ENCOUNTER — Ambulatory Visit (INDEPENDENT_AMBULATORY_CARE_PROVIDER_SITE_OTHER): Payer: Commercial Managed Care - PPO | Admitting: Family Medicine

## 2023-06-19 VITALS — BP 112/70 | HR 74 | Temp 98.2°F | Resp 18 | Ht 71.0 in | Wt 228.2 lb

## 2023-06-19 DIAGNOSIS — L049 Acute lymphadenitis, unspecified: Secondary | ICD-10-CM | POA: Insufficient documentation

## 2023-06-19 MED ORDER — AMOXICILLIN-POT CLAVULANATE 875-125 MG PO TABS
1.0000 | ORAL_TABLET | Freq: Two times a day (BID) | ORAL | 0 refills | Status: DC
  Filled 2023-06-19: qty 20, 10d supply, fill #0

## 2023-06-19 NOTE — Telephone Encounter (Signed)
Copied from CRM [id number]. Topic: Clinical - Red Word Triage >> Jun 19, 2023  7:34 AM Gaetano Hawthorne wrote: [facility] that prompted transfer to Nurse Triage: Patient has a knot on his neck below his ear - he claims that it causing him pain and discomfort - it is also limiting his range of motion. He states that it is hot to the touch and sore.  Chief Complaint: pain Symptoms: Lump located on Left side of neck - started off sore then become 5/10 painful lump that also limits ROM, area is warm to touch Frequency: Thursday or Friday Pertinent Negatives: Patient denies drainage Disposition: [] ED /[] Urgent Care (no appt availability in office) / [x] Appointment(In office/virtual)/ []  [facility]/ [] Home Care/ [] Refused Recommended Disposition /[] [place] [facility]/ []  Follow-up with PCP Additional Notes: pt has been applying ice to area  Reason for Disposition  [1] MODERATE pain (e.g., interferes with normal activities) AND [2] present > 3 days  Answer Assessment - Initial Assessment Questions 1. ONSET: "When did the muscle aches or body pains start?"      Thursday or Friday 2. LOCATION: "What part of your body is hurting?" (e.g., entire body, arms, legs)     Lump located on Left side of neck - started off sore then become painful lump that also limits ROM 3. SEVERITY: "How bad is the pain?" (Scale 1-10; or mild, moderate, severe)   - MILD (1-3): doesn't interfere with normal activities    - MODERATE (4-7): interferes with normal activities or awakens from sleep    - SEVERE (8-10):  excruciating pain, unable to do any normal activities      5/10 4. CAUSE: "What do you think is causing the pains?"     unknown 5. FEVER: "Have you been having fever?"     Lump is warm to touch 6. OTHER SYMPTOMS: "Do you have any other symptoms?" (e.g., chest pain, weakness, rash, cold or flu symptoms, weight loss)     Pain  7. PREGNANCY: "Is there any chance you are pregnant?" "When was your last menstrual  period?"     N/a 8. TRAVEL: "Have you traveled out of the country in the last month?" (e.g., travel history, exposures)     N/a  Protocols used: Muscle Aches and Body Pain-A-AH

## 2023-06-19 NOTE — Progress Notes (Signed)
  Marikay Alar, MD Phone: [phone]  [name] is a 58 y.o. male who presents today for same-day visit.  Lump on neck: Patient reports a lump on his left neck just inferior to his ear.  This has been present for 2 to 3 days.  Notes it is painful.  There is no fever.  There is no ear pain.  There is been no drainage.  Social History   Tobacco Use  Smoking Status Every Day   Current packs/day: 1.00   Average packs/day: 1 pack/day for 35.0 years (35.0 ttl pk-yrs)   Types: Cigarettes  Smokeless Tobacco Never    Current Outpatient Medications on File Prior to Visit  Medication Sig Dispense Refill   celecoxib (CELEBREX) 100 MG capsule Take 1 capsule (100 mg total) by mouth 2 (two) times daily. 180 capsule 3   pantoprazole (PROTONIX) 40 MG tablet Take 1 tablet (40 mg total) by mouth daily. 90 tablet 3   rosuvastatin (CRESTOR) 20 MG tablet Take 1 tablet (20 mg total) by mouth daily. 90 tablet 3   Semaglutide, 2 MG/DOSE, (OZEMPIC, 2 MG/DOSE,) 8 MG/3ML SOPN Inject 2 mg under the skin as directed once a week. 9 mL 2   No current facility-administered medications on file prior to visit.     ROS see history of present illness  Objective  Physical Exam Vitals:   06/19/23 1533  BP: 112/70  Pulse: 74  Resp: 18  Temp: 98.2 F (36.8 C)  SpO2: 97%    BP Readings from Last 3 Encounters:  06/19/23 112/70  05/19/23 120/68  12/09/22 134/80   Wt Readings from Last 3 Encounters:  06/19/23 228 lb 4 oz (103.5 kg)  05/19/23 233 lb (105.7 kg)  12/09/22 233 lb 6.4 oz (105.9 kg)    Physical Exam HENT:     Left Ear: Tympanic membrane and ear canal normal.     Mouth/Throat:     Comments: No drainage from Stensen's duct on the left or right    Area just inferior to his earlobe is swollen and tender with no appreciable overlying erythema, it is freely mobile, does not appear to be part of the parotid gland  Assessment/Plan: Please see individual problem  list.  Lymphadenitis, acute Assessment & Plan: Suspect infectious lymphadenitis.  Less likely related to parotitis.  Will start on Augmentin 1 tablet twice daily for 10 days.  He will follow-up later this week for recheck.  Discussed if he has any worsening symptoms or if he develops fever or uncontrollable pain he will seek medical attention immediately.   Other orders -     Amoxicillin-Pot Clavulanate; Take 1 tablet by mouth 2 (two) times daily.  Dispense: 20 tablet; Refill: 0     Return in about 3 days (around 06/22/2023) for recheck with PCP or any available provider.   Marikay Alar, MD [facility]

## 2023-06-19 NOTE — Patient Instructions (Addendum)
Nice to see you. I sent in augmentin for you to start on. If you have worsening symptoms on this please be reevaluated.

## 2023-06-19 NOTE — Telephone Encounter (Signed)
Appt scheduled to see Dr Birdie Sons today

## 2023-06-19 NOTE — Assessment & Plan Note (Signed)
Suspect infectious lymphadenitis.  Less likely related to parotitis.  Will start on Augmentin 1 tablet twice daily for 10 days.  He will follow-up later this week for recheck.  Discussed if he has any worsening symptoms or if he develops fever or uncontrollable pain he will seek medical attention immediately.

## 2023-06-19 NOTE — Telephone Encounter (Signed)
Noted

## 2023-06-22 ENCOUNTER — Telehealth: Payer: Self-pay

## 2023-06-22 NOTE — Telephone Encounter (Signed)
VM lefty to cb in regards to x ray mycrt msg sent as well

## 2023-06-23 ENCOUNTER — Other Ambulatory Visit: Payer: Self-pay

## 2023-06-23 ENCOUNTER — Telehealth: Payer: Self-pay | Admitting: Nurse Practitioner

## 2023-06-23 ENCOUNTER — Ambulatory Visit (INDEPENDENT_AMBULATORY_CARE_PROVIDER_SITE_OTHER): Payer: Commercial Managed Care - PPO | Admitting: Nurse Practitioner

## 2023-06-23 ENCOUNTER — Encounter: Payer: Self-pay | Admitting: Nurse Practitioner

## 2023-06-23 ENCOUNTER — Other Ambulatory Visit: Payer: Self-pay | Admitting: Nurse Practitioner

## 2023-06-23 VITALS — BP 110/68 | HR 67 | Temp 98.1°F | Ht 71.0 in | Wt 228.4 lb

## 2023-06-23 DIAGNOSIS — M19012 Primary osteoarthritis, left shoulder: Secondary | ICD-10-CM

## 2023-06-23 DIAGNOSIS — M19011 Primary osteoarthritis, right shoulder: Secondary | ICD-10-CM | POA: Diagnosis not present

## 2023-06-23 DIAGNOSIS — L049 Acute lymphadenitis, unspecified: Secondary | ICD-10-CM | POA: Diagnosis not present

## 2023-06-23 DIAGNOSIS — M25511 Pain in right shoulder: Secondary | ICD-10-CM | POA: Diagnosis not present

## 2023-06-23 DIAGNOSIS — G8929 Other chronic pain: Secondary | ICD-10-CM | POA: Diagnosis not present

## 2023-06-23 DIAGNOSIS — M25512 Pain in left shoulder: Secondary | ICD-10-CM | POA: Diagnosis not present

## 2023-06-23 DIAGNOSIS — J439 Emphysema, unspecified: Secondary | ICD-10-CM

## 2023-06-23 MED ORDER — ALBUTEROL SULFATE HFA 108 (90 BASE) MCG/ACT IN AERS
2.0000 | INHALATION_SPRAY | Freq: Four times a day (QID) | RESPIRATORY_TRACT | 2 refills | Status: DC | PRN
  Filled 2023-06-23: qty 6.7, 30d supply, fill #0

## 2023-06-23 NOTE — Telephone Encounter (Signed)
Detailed vm left informing pt that Manuel Dicker, NP did send the inhaler to the pharmacy.

## 2023-06-23 NOTE — Assessment & Plan Note (Signed)
Wheezing is noted on exam, possibly due to recent exposure to sick coworkers. Prescribe a rescue inhaler for use as needed for wheezing, shortness of breath, or coughing fits. Seek medical attention if symptoms worsen or persist for 5-7 days.

## 2023-06-23 NOTE — Assessment & Plan Note (Signed)
Referral to Ortho placed. X-rays showed severe degenerative changes bilaterally.

## 2023-06-23 NOTE — Telephone Encounter (Signed)
Copied from CRM [id number]. Topic: Clinical - Prescription Issue >> Jun 23, 2023  2:02 PM [name] wrote: Reason for CRM: Patient seen [name] today, said she would sent inhaler to [facility] pharmacy but the medication was not sent

## 2023-06-23 NOTE — Progress Notes (Signed)
[name], NP-C Phone: [phone]  [name] is a 58 y.o. male who presents today for re-check of lymphadentitis.   Discussed the use of AI scribe software for clinical note transcription with the patient, who gave verbal consent to proceed.  History of Present Illness   The patient presents for follow up on lymphadenitis and shoulder pain.  Three days ago, they were diagnosed with lymphadenitis and started on Augmentin, taken twice daily for ten days. The pain, initially located at the bottom of the left earlobe, has decreased, and the swelling has reduced. They have no current pain, drainage, or fever, but still experience some nausea.  They experience bilateral shoulder pain, with severe degenerative changes noted on a recent x-ray. The pain is described as 'bone to bone', and they wish to continue working in a limited capacity. Both shoulders are equally painful.  They feel wheezy and suspect they might be catching a cold, possibly from coworkers who have had a stomach virus and flu. They have a history of COPD but are not currently using any inhalers. No significant respiratory symptoms typically associated with their condition.      Social History   Tobacco Use  Smoking Status Every Day   Current packs/day: 1.00   Average packs/day: 1 pack/day for 35.0 years (35.0 ttl pk-yrs)   Types: Cigarettes  Smokeless Tobacco Never    Current Outpatient Medications on File Prior to Visit  Medication Sig Dispense Refill   amoxicillin-clavulanate (AUGMENTIN) 875-125 MG tablet Take 1 tablet by mouth 2 (two) times daily. 20 tablet 0   celecoxib (CELEBREX) 100 MG capsule Take 1 capsule (100 mg total) by mouth 2 (two) times daily. 180 capsule 3   pantoprazole (PROTONIX) 40 MG tablet Take 1 tablet (40 mg total) by mouth daily. 90 tablet 3   rosuvastatin (CRESTOR) 20 MG tablet Take 1 tablet (20 mg total) by mouth daily. 90 tablet 3   Semaglutide, 2 MG/DOSE, (OZEMPIC, 2 MG/DOSE,) 8  MG/3ML SOPN Inject 2 mg under the skin as directed once a week. 9 mL 2   No current facility-administered medications on file prior to visit.    ROS see history of present illness  Objective  Physical Exam Vitals:   06/23/23 1341  BP: 110/68  Pulse: 67  Temp: 98.1 F (36.7 C)  SpO2: 98%    BP Readings from Last 3 Encounters:  06/23/23 110/68  06/19/23 112/70  05/19/23 120/68   Wt Readings from Last 3 Encounters:  06/23/23 228 lb 6.4 oz (103.6 kg)  06/19/23 228 lb 4 oz (103.5 kg)  05/19/23 233 lb (105.7 kg)    Physical Exam Constitutional:      General: He is not in acute distress.    Appearance: Normal appearance.  HENT:     Head: Normocephalic.     Ears:     Comments: Mild inflammation noted below left ear lobe. No tenderness on palpation. No erythema or drainage present.  Cardiovascular:     Rate and Rhythm: Normal rate and regular rhythm.     Heart sounds: Normal heart sounds.  Pulmonary:     Effort: Pulmonary effort is normal.     Breath sounds: Wheezing (global) present.  Skin:    General: Skin is warm and dry.  Neurological:     General: No focal deficit present.     Mental Status: He is alert.  Psychiatric:        Mood and Affect: Mood normal.  Behavior: Behavior normal.    Assessment/Plan: Please see individual problem list.  Lymphadenitis, acute Assessment & Plan: Improvement is noted with Augmentin treatment, with no current pain or fever. Continue Augmentin twice daily for the remaining week. Report any worsening symptoms such as increased size, pain, heat, or fever.   Osteoarthritis of both shoulders, unspecified osteoarthritis type Assessment & Plan: A referral to orthopedics has been initiated, with likely surgical intervention required. Discuss work limitations and recovery timeline with the orthopedic surgeon.   Chronic pain of both shoulders Assessment & Plan: Referral to Ortho placed. X-rays showed severe degenerative changes  bilaterally.    Pulmonary emphysema, unspecified emphysema type [facility]) Assessment & Plan: Wheezing is noted on exam, possibly due to recent exposure to sick coworkers. Prescribe a rescue inhaler for use as needed for wheezing, shortness of breath, or coughing fits. Seek medical attention if symptoms worsen or persist for 5-7 days.   Orders: -     Albuterol Sulfate HFA; Inhale 2 puffs into the lungs every 6 (six) hours as needed for wheezing or shortness of breath.  Dispense: 8 g; Refill: 2   Return if symptoms worsen or fail to improve.   [name], NP-C [facility] - [facility]

## 2023-06-23 NOTE — Assessment & Plan Note (Signed)
A referral to orthopedics has been initiated, with likely surgical intervention required. Discuss work limitations and recovery timeline with the orthopedic surgeon.

## 2023-06-23 NOTE — Assessment & Plan Note (Signed)
Improvement is noted with Augmentin treatment, with no current pain or fever. Continue Augmentin twice daily for the remaining week. Report any worsening symptoms such as increased size, pain, heat, or fever.

## 2023-07-06 DIAGNOSIS — G8929 Other chronic pain: Secondary | ICD-10-CM | POA: Diagnosis not present

## 2023-07-06 DIAGNOSIS — M19011 Primary osteoarthritis, right shoulder: Secondary | ICD-10-CM | POA: Diagnosis not present

## 2023-07-06 DIAGNOSIS — M19012 Primary osteoarthritis, left shoulder: Secondary | ICD-10-CM | POA: Diagnosis not present

## 2023-07-19 ENCOUNTER — Other Ambulatory Visit (HOSPITAL_COMMUNITY): Payer: Self-pay

## 2023-07-19 ENCOUNTER — Other Ambulatory Visit: Payer: Self-pay

## 2023-07-31 ENCOUNTER — Other Ambulatory Visit: Payer: Self-pay

## 2023-07-31 ENCOUNTER — Other Ambulatory Visit: Payer: Self-pay | Admitting: Nurse Practitioner

## 2023-07-31 DIAGNOSIS — E785 Hyperlipidemia, unspecified: Secondary | ICD-10-CM

## 2023-07-31 DIAGNOSIS — K219 Gastro-esophageal reflux disease without esophagitis: Secondary | ICD-10-CM

## 2023-07-31 MED ORDER — ROSUVASTATIN CALCIUM 20 MG PO TABS
20.0000 mg | ORAL_TABLET | Freq: Every day | ORAL | 3 refills | Status: AC
  Filled 2023-07-31: qty 90, 90d supply, fill #0
  Filled 2023-10-26: qty 90, 90d supply, fill #1
  Filled 2024-01-29: qty 90, 90d supply, fill #2
  Filled 2024-04-24: qty 90, 90d supply, fill #3

## 2023-07-31 MED ORDER — PANTOPRAZOLE SODIUM 40 MG PO TBEC
40.0000 mg | DELAYED_RELEASE_TABLET | Freq: Every day | ORAL | 3 refills | Status: AC
  Filled 2023-07-31: qty 90, 90d supply, fill #0
  Filled 2023-10-26: qty 90, 90d supply, fill #1
  Filled 2024-01-29: qty 90, 90d supply, fill #2
  Filled 2024-04-24: qty 90, 90d supply, fill #3

## 2023-08-01 ENCOUNTER — Other Ambulatory Visit: Payer: Self-pay

## 2023-08-22 ENCOUNTER — Telehealth: Payer: Self-pay | Admitting: *Deleted

## 2023-08-22 NOTE — Telephone Encounter (Signed)
 Called and left VM for patient to call to schedule yearly lung cancer screening CT.

## 2023-10-04 ENCOUNTER — Other Ambulatory Visit: Payer: Self-pay

## 2023-10-26 ENCOUNTER — Other Ambulatory Visit (HOSPITAL_COMMUNITY): Payer: Self-pay

## 2023-12-04 ENCOUNTER — Other Ambulatory Visit: Payer: Self-pay

## 2023-12-06 ENCOUNTER — Telehealth: Payer: Self-pay

## 2023-12-06 NOTE — Telephone Encounter (Signed)
 Copied from CRM [id number]. Topic: Appointments - Scheduling Inquiry for Clinic >> Dec 06, 2023 10:37 AM Lavanda D wrote: Reason for CRM: Patient is calling because he recently rescheduled his physical due to his PCP being out of office and he has found out now that in order for his insurance to not charge him extra his physical needs to be prior to 01/22/24. He is also requesting an appointment on a Friday because of work.  I spoke with patient and scheduled an appointment for him to have his physical with Leron Glance, NP, on 01/18/2024.

## 2023-12-29 ENCOUNTER — Encounter: Admitting: Nurse Practitioner

## 2024-01-02 ENCOUNTER — Other Ambulatory Visit: Payer: Self-pay | Admitting: Nurse Practitioner

## 2024-01-02 DIAGNOSIS — E119 Type 2 diabetes mellitus without complications: Secondary | ICD-10-CM

## 2024-01-03 ENCOUNTER — Other Ambulatory Visit: Payer: Self-pay

## 2024-01-03 ENCOUNTER — Other Ambulatory Visit (HOSPITAL_COMMUNITY): Payer: Self-pay

## 2024-01-03 MED ORDER — OZEMPIC (2 MG/DOSE) 8 MG/3ML ~~LOC~~ SOPN
2.0000 mg | PEN_INJECTOR | SUBCUTANEOUS | 2 refills | Status: AC
  Filled 2024-01-03: qty 9, 84d supply, fill #0
  Filled 2024-03-22 – 2024-04-03 (×2): qty 9, 84d supply, fill #1

## 2024-01-18 ENCOUNTER — Ambulatory Visit (INDEPENDENT_AMBULATORY_CARE_PROVIDER_SITE_OTHER): Admitting: Nurse Practitioner

## 2024-01-18 ENCOUNTER — Encounter: Payer: Self-pay | Admitting: Nurse Practitioner

## 2024-01-18 VITALS — BP 140/98 | HR 79 | Temp 97.9°F | Ht 71.0 in | Wt 229.6 lb

## 2024-01-18 DIAGNOSIS — E119 Type 2 diabetes mellitus without complications: Secondary | ICD-10-CM

## 2024-01-18 DIAGNOSIS — Z0000 Encounter for general adult medical examination without abnormal findings: Secondary | ICD-10-CM

## 2024-01-18 DIAGNOSIS — G8929 Other chronic pain: Secondary | ICD-10-CM

## 2024-01-18 DIAGNOSIS — M25511 Pain in right shoulder: Secondary | ICD-10-CM | POA: Diagnosis not present

## 2024-01-18 DIAGNOSIS — Z1329 Encounter for screening for other suspected endocrine disorder: Secondary | ICD-10-CM

## 2024-01-18 DIAGNOSIS — Z125 Encounter for screening for malignant neoplasm of prostate: Secondary | ICD-10-CM

## 2024-01-18 DIAGNOSIS — F17210 Nicotine dependence, cigarettes, uncomplicated: Secondary | ICD-10-CM | POA: Diagnosis not present

## 2024-01-18 DIAGNOSIS — J439 Emphysema, unspecified: Secondary | ICD-10-CM

## 2024-01-18 DIAGNOSIS — R030 Elevated blood-pressure reading, without diagnosis of hypertension: Secondary | ICD-10-CM

## 2024-01-18 DIAGNOSIS — Z7985 Long-term (current) use of injectable non-insulin antidiabetic drugs: Secondary | ICD-10-CM

## 2024-01-18 DIAGNOSIS — E785 Hyperlipidemia, unspecified: Secondary | ICD-10-CM

## 2024-01-18 DIAGNOSIS — M25512 Pain in left shoulder: Secondary | ICD-10-CM

## 2024-01-18 NOTE — Progress Notes (Signed)
 Leron Glance, NP-C Phone: [phone]  [name] is a 58 y.o. male who presents today for annual exam.   Discussed the use of AI scribe software for clinical note transcription with the patient, who gave verbal consent to proceed.  History of Present Illness   [name] is a 58 year old male who presents for annual exam.  He is currently taking Ozempic  for diabetes management and Crestor  for hyperlipidemia. He does not regularly check his blood sugar and has no symptoms such as excessive thirst or urination. His diet is influenced by Ozempic , which curbs his appetite. He consumes air-fried meats and a lot of watermelon, but has a fondness for sweets and occasional soda consumption.  He experiences persistent shoulder pain, which is managed with Celebrex , noting that it helps alleviate the discomfort. The pain is described as manageable.  He has no history of hypertension.  He continues to smoke a pack of cigarettes daily and wants to quit, although he finds it challenging. He denies alcohol and drug use.  He reports waking up several times at night, sometimes to use the bathroom, and acknowledges snoring, which his wife says has improved since he lost weight. No significant mood or anxiety issues are present, but he admits to feeling short-tempered at times.  He has a family history of prostate cancer, with his father having had the disease. He has had a colonoscopy in 2023 and has received shingles, tetanus (in 2019), and three COVID-19 vaccines.      Social History   Tobacco Use  Smoking Status Every Day   Current packs/day: 1.00   Average packs/day: 1 pack/day for 35.0 years (35.0 ttl pk-yrs)   Types: Cigarettes  Smokeless Tobacco Never    Current Outpatient Medications on File Prior to Visit  Medication Sig Dispense Refill   celecoxib  (CELEBREX ) 100 MG capsule Take 1 capsule (100 mg total) by mouth 2 (two) times daily. 180 capsule 3   pantoprazole  (PROTONIX ) 40  MG tablet Take 1 tablet (40 mg total) by mouth daily. 90 tablet 3   rosuvastatin  (CRESTOR ) 20 MG tablet Take 1 tablet (20 mg total) by mouth daily. 90 tablet 3   Semaglutide , 2 MG/DOSE, (OZEMPIC , 2 MG/DOSE,) 8 MG/3ML SOPN Inject 2 mg under the skin as directed once a week. 9 mL 2   No current facility-administered medications on file prior to visit.     ROS see history of present illness  Objective  Physical Exam Vitals:   01/18/24 1532 01/18/24 1543  BP: (!) 148/94 (!) 140/98  Pulse: 79   Temp: 97.9 F (36.6 C)   SpO2: 97%     BP Readings from Last 3 Encounters:  01/18/24 (!) 140/98  06/23/23 110/68  06/19/23 112/70   Wt Readings from Last 3 Encounters:  01/18/24 229 lb 9.6 oz (104.1 kg)  06/23/23 228 lb 6.4 oz (103.6 kg)  06/19/23 228 lb 4 oz (103.5 kg)    Physical Exam Constitutional:      General: He is not in acute distress.    Appearance: Normal appearance.  HENT:     Head: Normocephalic.     Right Ear: Tympanic membrane normal.     Left Ear: Tympanic membrane normal.     Nose: Nose normal.     Mouth/Throat:     Mouth: Mucous membranes are moist.     Pharynx: Oropharynx is clear.  Eyes:     Conjunctiva/sclera: Conjunctivae normal.     Pupils: Pupils are equal,  round, and reactive to light.  Neck:     Thyroid : No thyromegaly.  Cardiovascular:     Rate and Rhythm: Normal rate and regular rhythm.     Heart sounds: Normal heart sounds.  Pulmonary:     Effort: Pulmonary effort is normal.     Breath sounds: Normal breath sounds.  Abdominal:     General: Abdomen is flat. Bowel sounds are normal.     Palpations: Abdomen is soft. There is no mass.     Tenderness: There is no abdominal tenderness.  Musculoskeletal:        General: Normal range of motion.  Lymphadenopathy:     Cervical: No cervical adenopathy.  Skin:    General: Skin is warm and dry.     Findings: No rash.  Neurological:     General: No focal deficit present.     Mental Status: He is  alert.  Psychiatric:        Mood and Affect: Mood normal.        Behavior: Behavior normal.      Assessment/Plan: Please see individual problem list.  Preventative health care Assessment & Plan: Physical exam complete. We will order lab work as outlined. Colonoscopy is up to date. Prostate screening today in lab work. Flu and tetanus vaccines are up to date. Declines additional COVID vaccines. Shingles vaccine series completed. Encourage routine dental exams, continue regular eye exams. Encourage healthy diet and regular exercise. Return to care in 6 months, sooner as needed.   Orders: -     CBC with Differential/Platelet  Elevated blood pressure reading in office without diagnosis of hypertension Assessment & Plan: Elevated blood pressure x 2 readings in office today. Patient reports rushing to get to appointment. Previously well controlled. Not on any medications. No Hx of hypertension. We will continue to monitor.    Type 2 diabetes mellitus without complication, without long-term current use of insulin (HCC) Assessment & Plan: Managed with Ozempic , with no hyperglycemia symptoms and decreased appetite. Continue Ozempic  2 mg weekly. Encourage healthy diet and regular exercise.   Orders: -     Hemoglobin A1c -     Microalbumin / creatinine urine ratio  Hyperlipidemia, unspecified hyperlipidemia type Assessment & Plan: Managed with Crestor  without new issues. Continue Crestor .  Orders: -     Lipid panel -     Comprehensive metabolic panel with GFR  Chronic pain of both shoulders Assessment & Plan: Pain persists but is manageable with Celebrex . Continue Celebrex .   Smokes with greater than 30 pack year history Assessment & Plan: Smokes a pack a day and is interested in quitting, though acknowledges difficulty. Provide support and encouragement for smoking cessation. Counseling provided.   Orders: -     Ambulatory Referral for Lung Cancer Scre  Pulmonary emphysema,  unspecified emphysema type (HCC) Assessment & Plan: Not using any inhalers. Denies shortness of breath. Continues to smoke. Tobacco cessation counseling provided. Lung Cancer Screening ordered.   Orders: -     Ambulatory Referral for Lung Cancer Scre  Thyroid  disorder screen -     TSH  Screening PSA (prostate specific antigen) -     PSA     Return in about 6 months (around 07/20/2024) for Follow up.   Leron Glance, NP-C [facility]

## 2024-01-19 LAB — LIPID PANEL
Cholesterol: 102 mg/dL (ref 0–200)
HDL: 40.2 mg/dL (ref >39.00)
LDL Cholesterol: 41 mg/dL (ref 0–99)
NonHDL: 61.39
Total CHOL/HDL Ratio: 3
Triglycerides: 100 mg/dL (ref 0.0–149.0)
VLDL: 20 mg/dL (ref 0.0–40.0)

## 2024-01-19 LAB — PSA: PSA: 1.26 ng/mL (ref 0.10–4.00)

## 2024-01-19 LAB — CBC WITH DIFFERENTIAL/PLATELET
Basophils Absolute: 0 K/uL (ref 0.0–0.1)
Basophils Relative: 0.7 % (ref 0.0–3.0)
Eosinophils Absolute: 0.3 K/uL (ref 0.0–0.7)
Eosinophils Relative: 3.7 % (ref 0.0–5.0)
HCT: 46.9 % (ref 39.0–52.0)
Hemoglobin: 16 g/dL (ref 13.0–17.0)
Lymphocytes Relative: 23.5 % (ref 12.0–46.0)
Lymphs Abs: 1.7 K/uL (ref 0.7–4.0)
MCHC: 34.1 g/dL (ref 30.0–36.0)
MCV: 87.4 fl (ref 78.0–100.0)
Monocytes Absolute: 0.6 K/uL (ref 0.1–1.0)
Monocytes Relative: 7.6 % (ref 3.0–12.0)
Neutro Abs: 4.6 K/uL (ref 1.4–7.7)
Neutrophils Relative %: 64.5 % (ref 43.0–77.0)
Platelets: 249 K/uL (ref 150.0–400.0)
RBC: 5.37 Mil/uL (ref 4.22–5.81)
RDW: 13.4 % (ref 11.5–15.5)
WBC: 7.2 K/uL (ref 4.0–10.5)

## 2024-01-19 LAB — COMPREHENSIVE METABOLIC PANEL WITH GFR
ALT: 19 U/L (ref 0–53)
AST: 16 U/L (ref 0–37)
Albumin: 4.3 g/dL (ref 3.5–5.2)
Alkaline Phosphatase: 59 U/L (ref 39–117)
BUN: 17 mg/dL (ref 6–23)
CO2: 27 meq/L (ref 19–32)
Calcium: 9.1 mg/dL (ref 8.4–10.5)
Chloride: 107 meq/L (ref 96–112)
Creatinine, Ser: 0.8 mg/dL (ref 0.40–1.50)
GFR: 96.09 mL/min (ref >60.00)
Glucose, Bld: 124 mg/dL — ABNORMAL HIGH (ref 70–99)
Potassium: 3.8 meq/L (ref 3.5–5.1)
Sodium: 143 meq/L (ref 135–145)
Total Bilirubin: 0.9 mg/dL (ref 0.2–1.2)
Total Protein: 6.7 g/dL (ref 6.0–8.3)

## 2024-01-19 LAB — MICROALBUMIN / CREATININE URINE RATIO
Creatinine,U: 177.4 mg/dL
Microalb Creat Ratio: 8.2 mg/g (ref 0.0–30.0)
Microalb, Ur: 1.5 mg/dL (ref 0.0–1.9)

## 2024-01-19 LAB — TSH: TSH: 0.78 u[IU]/mL (ref 0.35–5.50)

## 2024-01-19 LAB — HEMOGLOBIN A1C: Hgb A1c MFr Bld: 5.5 % (ref 4.6–6.5)

## 2024-01-26 ENCOUNTER — Ambulatory Visit: Payer: Self-pay | Admitting: Nurse Practitioner

## 2024-01-29 ENCOUNTER — Other Ambulatory Visit (HOSPITAL_COMMUNITY): Payer: Self-pay

## 2024-02-01 ENCOUNTER — Encounter: Payer: Self-pay | Admitting: Nurse Practitioner

## 2024-02-01 DIAGNOSIS — R030 Elevated blood-pressure reading, without diagnosis of hypertension: Secondary | ICD-10-CM | POA: Insufficient documentation

## 2024-02-01 DIAGNOSIS — F17210 Nicotine dependence, cigarettes, uncomplicated: Secondary | ICD-10-CM | POA: Insufficient documentation

## 2024-02-01 NOTE — Assessment & Plan Note (Signed)
 Not using any inhalers. Denies shortness of breath. Continues to smoke. Tobacco cessation counseling provided. Lung Cancer Screening ordered.

## 2024-02-01 NOTE — Assessment & Plan Note (Signed)
 Managed with Crestor  without new issues. Continue Crestor .

## 2024-02-01 NOTE — Assessment & Plan Note (Signed)
 Elevated blood pressure x 2 readings in office today. Patient reports rushing to get to appointment. Previously well controlled. Not on any medications. No Hx of hypertension. We will continue to monitor.

## 2024-02-01 NOTE — Assessment & Plan Note (Addendum)
 Physical exam complete. We will order lab work as outlined. Colonoscopy is up to date. Prostate screening today in lab work. Flu and tetanus vaccines are up to date. Declines additional COVID vaccines. Shingles vaccine series completed. Encourage routine dental exams, continue regular eye exams. Encourage healthy diet and regular exercise. Return to care in 6 months, sooner as needed.

## 2024-02-01 NOTE — Assessment & Plan Note (Signed)
 Pain persists but is manageable with Celebrex . Continue Celebrex .

## 2024-02-01 NOTE — Assessment & Plan Note (Addendum)
 Smokes a pack a day and is interested in quitting, though acknowledges difficulty. Provide support and encouragement for smoking cessation. Counseling provided.

## 2024-02-01 NOTE — Assessment & Plan Note (Signed)
 Managed with Ozempic , with no hyperglycemia symptoms and decreased appetite. Continue Ozempic  2 mg weekly. Encourage healthy diet and regular exercise.

## 2024-02-02 ENCOUNTER — Encounter: Payer: Self-pay | Admitting: *Deleted

## 2024-02-09 ENCOUNTER — Encounter: Admitting: Nurse Practitioner

## 2024-02-16 ENCOUNTER — Other Ambulatory Visit: Payer: Self-pay

## 2024-02-16 DIAGNOSIS — Z122 Encounter for screening for malignant neoplasm of respiratory organs: Secondary | ICD-10-CM

## 2024-02-16 DIAGNOSIS — Z87891 Personal history of nicotine dependence: Secondary | ICD-10-CM

## 2024-02-16 DIAGNOSIS — F17210 Nicotine dependence, cigarettes, uncomplicated: Secondary | ICD-10-CM

## 2024-03-01 ENCOUNTER — Telehealth (HOSPITAL_BASED_OUTPATIENT_CLINIC_OR_DEPARTMENT_OTHER): Payer: Self-pay

## 2024-03-22 ENCOUNTER — Other Ambulatory Visit: Payer: Self-pay

## 2024-03-22 ENCOUNTER — Other Ambulatory Visit (HOSPITAL_COMMUNITY): Payer: Self-pay

## 2024-03-26 ENCOUNTER — Other Ambulatory Visit: Payer: Self-pay

## 2024-03-29 ENCOUNTER — Other Ambulatory Visit: Payer: Self-pay

## 2024-04-03 ENCOUNTER — Other Ambulatory Visit (HOSPITAL_COMMUNITY): Payer: Self-pay

## 2024-04-03 ENCOUNTER — Other Ambulatory Visit: Payer: Self-pay

## 2024-04-16 ENCOUNTER — Telehealth: Payer: Self-pay | Admitting: Nurse Practitioner

## 2024-04-16 NOTE — Telephone Encounter (Signed)
 Lm and sent MyChart: [name] will be out of the office on February 27th.  Please call the office or reschedule your appointment through MyChart.  Thank you. message:

## 2024-04-25 ENCOUNTER — Other Ambulatory Visit (HOSPITAL_COMMUNITY): Payer: Self-pay

## 2024-07-19 ENCOUNTER — Ambulatory Visit: Admitting: Nurse Practitioner
